# Patient Record
Sex: Male | Born: 1990 | Race: White | Hispanic: No | Marital: Married | Smoking: Current every day smoker
Health system: Southern US, Community
[De-identification: ages and names within clinical notes are randomized; demographics above are authoritative.]

## PROBLEM LIST (undated history)

## (undated) DIAGNOSIS — F32A Depression, unspecified: Secondary | ICD-10-CM

## (undated) DIAGNOSIS — L089 Local infection of the skin and subcutaneous tissue, unspecified: Secondary | ICD-10-CM

## (undated) DIAGNOSIS — F419 Anxiety disorder, unspecified: Secondary | ICD-10-CM

## (undated) DIAGNOSIS — F319 Bipolar disorder, unspecified: Secondary | ICD-10-CM

## (undated) DIAGNOSIS — F909 Attention-deficit hyperactivity disorder, unspecified type: Secondary | ICD-10-CM

## (undated) HISTORY — PX: OTHER SURGICAL HISTORY: SHX169

---
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
## 2011-01-13 ENCOUNTER — Inpatient Hospital Stay (INDEPENDENT_AMBULATORY_CARE_PROVIDER_SITE_OTHER)
Admission: RE | Admit: 2011-01-13 | Discharge: 2011-01-13 | Disposition: A | Discharge status: Home / Self Care | Payer: Medicaid Other | Source: Ambulatory Visit | Attending: Family Medicine | Admitting: Family Medicine

## 2011-01-13 DIAGNOSIS — M799 Soft tissue disorder, unspecified: Secondary | ICD-10-CM

## 2011-02-21 LAB — GLUCOSE, CAPILLARY: Glucose-Capillary: 96 mg/dL (ref 70–99)

## 2011-04-26 NOTE — Op Note (Signed)
[place]  Patient:    Arthur Mcdonald, Arthur Mcdonald Visit Number: [id number] MRN: [id number]          Service Type: DSU Location: [facility] Attending Physician:  Leonia Corona Dictated by:   Judie Petit. Leonia Corona, M.D. Proc. Date: 08/27/01 Admit Date:  08/27/2001   CC:         Louise A. Twiselton, M.D.   Operative Report  PREOPERATIVE DIAGNOSES: 1. Right parietal scalp lesion, possible sebaceous nevus. 2. Painful scar, left forehead.  POSTOPERATIVE DIAGNOSES: 1. Right parietal scalp lesion, possible sebaceous nevus. 2. Painful scar, left forehead.  PROCEDURES:  Wide excision of right parietal scalp lesion.  ANESTHESIA:  General laryngeal mask anesthesia.  SURGEON:  Nelida Meuse, M.D.  ASSISTANTDonnella Bi D. Pendse, M.D.  DESCRIPTION OF PROCEDURE:  The patient is brought into operating room and placed supine upon the operating table.  General laryngeal mask anesthesia is given.  The head is rotated toward the left side to bring the right parietal scalp lesion clearly in the field.  The head was shaved around the lesion and then cleaned and prepped in the usual manner.  A 2 mm margin was marked with a marker all around the oval lesion.  Approximately 9 cc of 0.25% Marcaine with epinephrine was infiltrated around the lesion to minimize operative bleeding as well as postoperative pain control.  The incision was now made with a knife.  A deep incision was made up to the area of the periosteum, saving the periosteum.  Then the oval-shaped lesion with elliptical incision was lifted off the periosteum and removed from the field.  Bleeders were cauterized.  The scalp flap was raised on all sides by opening movement of the scissors for about 1-2 cm on all four sides.  Once again the wound was irrigated and looked for any small bleeders, which were cauterized.  No active bleeding or oozing was noted at this point, and then the wound was approximated and  repaired using 3-0 Prolene interrupted stitches.  Primary closure was easily achieved without much tension of the scar on the suture line.  A sterile dressing was applied.  The patient tolerated the procedure very well.  We now turned our attention toward the left forehead scar.  On very careful palpation, we could not palpate any foreign body; hence, I went out of the operating room to discuss the possibility of not exploring this wound since it appears to have healed since our last examination.  A decision after consultation with the father was made not to explore the left forehead wound; hence, we completed the procedure of wide excision of the right parietal scalp without any problems.  The patient was extubated and transported to the recovery room in good and stable condition. Dictated by:   Judie Petit. Leonia Corona, M.D. Attending Physician:  Leonia Corona DD:  08/27/01 TD:  08/27/01 Job: [id number]

## 2011-04-26 NOTE — Discharge Summary (Signed)
[facility]  Patient:    Arthur Mcdonald, Arthur Mcdonald                        MRN: [id number] Adm. Date:  [id number] Disc. Date: [id number] Attending:  Jasmine Pang Dictator:   Carolanne Grumbling, M.D.                           Discharge Summary  INITIAL ASSESSMENT AND DIAGNOSIS:  Akim was admitted to the hospital after a long history of mood instability and disruptive behaviors.  He had been increasing aggressive according to his family.  He had been screaming and fighting as well as harming others.  He lives with his grandparents, who have cared for him most of his life, but they were feeling unsafe at home with him. He also had begun threatening to run away from home.  He was admitted to the service of Dr. Milford Cage.  MENTAL STATUS:  At the time of the initial evaluation revealed a reserved but friendly boy with poor eye contact.  Mood seemed depressed.  There was no suicidal or homicidal ideation.  There was no evidence of any psychotic thinking or behavior.  He seemed to be of at least average intelligence. Concentration was poor, insight was minimal, and judgment seemed poor.  Other pertinent history can be obtained from the psychosocial service summary.  PHYSICAL EXAMINATION:  Noncontributory.  ADMITTING DIAGNOSES: Axis I:     1. Mood disorder not otherwise specified.             2. Attention deficit hyperactivity disorder, combined type.             3. 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 Axis II:    Deferred. Axis III:   Healthy. Axis IV:    Severe. Axis V:     30.  FINDINGS ON INDICATED LABORATORY EXAMINATIONS:  Within normal limits or noncontributing.  Lithium level on the May 1 was 0.57 and on the May 7 was 1.98.  Dr. Katrinka Blazing was notified regarding the lithium and the family was contacted to decrease the lithium dose.  HOSPITAL COURSE:  While in the hospital Jaskaran initially was very hyper, intrusive, requiring redirection  regularly.  He was friendly but just seemed to be unable to slow himself down.  His medications were adjusted.  His lithium was increased.  Ritalin was added, and Clonidine was increased significantly, and he seemed to calm down at least on the unit.  His grandparents had begun the process of application for [facility] and that continued throughout his stay.  By the time of discharge, he was apologetic to his grandparents for being out of control and all the bad things he had done. He indicated that he loved them, and was happy that someone loved him because he said a lot of kids did not have people to love them.  CONDITION AT TIME OF DISCHARGE:  He was calmer, more focused, required much less redirection and less hyper.  POST HOSPITAL CARE PLAN:  He is referred back to Dr. Phillip Heal with an appointment for May 17 and will follow up with Doran Heater.  At the time of discharge, he was taking Clonidine 0.1 mg four times daily, Lithobid 300 mg in the morning and 600 mg in the afternoon (this dose has been adjusted downwards due to high Lithobid level), Zyprexa 2.5 mg twice daily, Ritalin 10 mg in  the morning and at noon and 5 mg at 4 p.m.  There were no restrictions placed on his activity or his diet.  FINAL DIAGNOSES: Axis I:     1. Mood disorder not otherwise specified.             2. Attention deficit hyperactivity disorder combined type.             3. Intermittent explosive disorder. Axis II:    No diagnosis. Axis III:   Healthy. Axis IV:    Severe. Axis V:     50. DD:  04/28/00 TD:  04/28/00 Job: [id number]
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
## 2011-05-18 ENCOUNTER — Emergency Department (HOSPITAL_COMMUNITY)
Admission: EM | Admit: 2011-05-18 | Discharge: 2011-05-18 | Disposition: A | Discharge status: Home / Self Care | Payer: Medicaid Other | Attending: Emergency Medicine | Admitting: Emergency Medicine

## 2011-05-18 DIAGNOSIS — F319 Bipolar disorder, unspecified: Secondary | ICD-10-CM | POA: Insufficient documentation

## 2011-05-18 DIAGNOSIS — F988 Other specified behavioral and emotional disorders with onset usually occurring in childhood and adolescence: Secondary | ICD-10-CM | POA: Insufficient documentation

## 2011-05-18 DIAGNOSIS — W260XXA Contact with knife, initial encounter: Secondary | ICD-10-CM | POA: Insufficient documentation

## 2011-05-18 DIAGNOSIS — Z79899 Other long term (current) drug therapy: Secondary | ICD-10-CM | POA: Insufficient documentation

## 2011-05-18 DIAGNOSIS — S61209A Unspecified open wound of unspecified finger without damage to nail, initial encounter: Secondary | ICD-10-CM | POA: Insufficient documentation

## 2011-05-18 DIAGNOSIS — Y93G1 Activity, food preparation and clean up: Secondary | ICD-10-CM | POA: Insufficient documentation

## 2011-11-27 ENCOUNTER — Other Ambulatory Visit: Payer: Self-pay

## 2011-11-27 ENCOUNTER — Encounter: Payer: Self-pay | Admitting: Emergency Medicine

## 2011-11-27 ENCOUNTER — Emergency Department (HOSPITAL_COMMUNITY): Payer: Medicaid Other

## 2011-11-27 ENCOUNTER — Emergency Department (HOSPITAL_COMMUNITY)
Admission: EM | Admit: 2011-11-27 | Discharge: 2011-11-27 | Disposition: A | Discharge status: Home / Self Care | Payer: Medicaid Other | Attending: Emergency Medicine | Admitting: Emergency Medicine

## 2011-11-27 DIAGNOSIS — R059 Cough, unspecified: Secondary | ICD-10-CM | POA: Insufficient documentation

## 2011-11-27 DIAGNOSIS — R509 Fever, unspecified: Secondary | ICD-10-CM | POA: Insufficient documentation

## 2011-11-27 DIAGNOSIS — R112 Nausea with vomiting, unspecified: Secondary | ICD-10-CM | POA: Insufficient documentation

## 2011-11-27 DIAGNOSIS — R197 Diarrhea, unspecified: Secondary | ICD-10-CM | POA: Insufficient documentation

## 2011-11-27 DIAGNOSIS — F17200 Nicotine dependence, unspecified, uncomplicated: Secondary | ICD-10-CM | POA: Insufficient documentation

## 2011-11-27 DIAGNOSIS — R05 Cough: Secondary | ICD-10-CM | POA: Insufficient documentation

## 2011-11-27 DIAGNOSIS — J111 Influenza due to unidentified influenza virus with other respiratory manifestations: Secondary | ICD-10-CM | POA: Insufficient documentation

## 2011-11-27 MED ORDER — OXYCODONE-ACETAMINOPHEN 5-325 MG PO TABS: 1.0000 | ORAL_TABLET | ORAL | Status: AC | PRN

## 2011-11-27 MED ORDER — ALBUTEROL SULFATE HFA 108 (90 BASE) MCG/ACT IN AERS
2.0000 | INHALATION_SPRAY | RESPIRATORY_TRACT | Status: DC | PRN
  Administered 2011-11-27: 2 via RESPIRATORY_TRACT
  Filled 2011-11-27: qty 6.7

## 2011-11-27 MED ORDER — ONDANSETRON 8 MG PO TBDP: 8.0000 mg | ORAL_TABLET | Freq: Three times a day (TID) | ORAL | Status: AC | PRN

## 2011-11-27 MED ORDER — MORPHINE SULFATE 4 MG/ML IJ SOLN
4.0000 mg | Freq: Once | INTRAMUSCULAR | Status: AC
  Administered 2011-11-27: 4 mg via INTRAVENOUS
  Filled 2011-11-27: qty 1

## 2011-11-27 MED ORDER — ONDANSETRON HCL 4 MG/2ML IJ SOLN
4.0000 mg | Freq: Once | INTRAMUSCULAR | Status: AC
  Administered 2011-11-27: 4 mg via INTRAVENOUS
  Filled 2011-11-27: qty 2

## 2011-11-27 MED ORDER — SODIUM CHLORIDE 0.9 % IV BOLUS (SEPSIS)
1000.0000 mL | Freq: Once | INTRAVENOUS | Status: AC
  Administered 2011-11-27: 1000 mL via INTRAVENOUS

## 2011-11-27 NOTE — ED Notes (Signed)
Pt stated that he has beecoug

## 2011-11-27 NOTE — ED Provider Notes (Signed)
History     CSN: [id number] Arrival date & time: 11/27/2011  3:45 PM   First MD Initiated Contact with Patient 11/27/11 1955      Chief Complaint  Patient presents with  . Cough  . Fever    (Consider location/radiation/quality/duration/timing/severity/associated sxs/prior treatment) Patient is a 20 y.o. male presenting with cough and fever. The history is provided by the patient.  Cough This is a new problem. The current episode started more than 1 week ago. Associated symptoms include chills and myalgias. Pertinent negatives include no chest pain.  Fever Primary symptoms of the febrile illness include fever, fatigue, cough, nausea, vomiting, diarrhea and myalgias. Primary symptoms do not include rash.   patient states he is a cough with congestion for last 7 days. He states he had fever to is also been vomiting and diarrhea. He has fevers and chills. He has myalgias. He states that contact with other people who have viruses. He states he feels fatigued. He states he is hungry but has been unable to eat.  History reviewed. No pertinent past medical history.  History reviewed. No pertinent past surgical history.  History reviewed. No pertinent family history.  History  Substance Use Topics  . Smoking status: Current Everyday Smoker  . Smokeless tobacco: Not on file  . Alcohol Use: No      Review of Systems  Constitutional: Positive for fever, chills, appetite change and fatigue.  Respiratory: Positive for cough.   Cardiovascular: Negative for chest pain.  Gastrointestinal: Positive for nausea, vomiting and diarrhea.  Genitourinary: Negative for flank pain and decreased urine volume.  Musculoskeletal: Positive for myalgias.  Skin: Negative for rash.  Neurological: Negative for syncope.  Psychiatric/Behavioral: Negative for confusion.    Allergies  Review of patient's allergies indicates no known allergies.  Home Medications   Current Outpatient Rx  Name Route Sig  Dispense Refill  . ONDANSETRON 8 MG PO TBDP Oral Take 1 tablet (8 mg total) by mouth every 8 (eight) hours as needed for nausea. 20 tablet 0  . OXYCODONE-ACETAMINOPHEN 5-325 MG PO TABS Oral Take 1 tablet by mouth every 4 (four) hours as needed for pain. 15 tablet 0    BP 128/81  Pulse 119  Temp(Src) 101.2 F (38.4 C) (Oral)  Resp 18  SpO2 100%  Physical Exam  Nursing note and vitals reviewed. Constitutional: He is oriented to person, place, and time. He appears well-developed and well-nourished.  HENT:  Head: Normocephalic and atraumatic.  Eyes: EOM are normal. Pupils are equal, round, and reactive to light.  Neck: Normal range of motion. Neck supple.  Cardiovascular: Normal rate and normal heart sounds.   No murmur heard.      Tachycardia  Pulmonary/Chest: No respiratory distress. He has wheezes. He has no rales.  Abdominal: Soft. Bowel sounds are normal. He exhibits no distension and no mass. There is no tenderness. There is no rebound and no guarding.  Musculoskeletal: Normal range of motion. He exhibits no edema.  Neurological: He is alert and oriented to person, place, and time. No cranial nerve deficit.  Skin: Skin is warm and dry.  Psychiatric: He has a normal mood and affect.    ED Course  Procedures (including critical care time)  Labs Reviewed - No data to display Dg Chest 2 View  11/27/2011  *RADIOLOGY REPORT*  Clinical Data: Cough, fever  CHEST - 2 VIEW  Comparison: Report 10/03/2000 no images available  Findings: Cardiomediastinal silhouette is unremarkable.  No acute infiltrate or pleural  effusion.  No pulmonary edema.  Central mild increase bronchial markings suspicious for bronchitic changes.  IMPRESSION: No acute infiltrate or edema.  Central mild bronchitic changes are suspected.  Original Report Authenticated By: Natasha Mead, M.D.     1. Influenza     Date: 11/28/2011  Rate: 100  Rhythm: normal sinus rhythm  QRS Axis: normal  Intervals: normal  ST/T  Wave abnormalities: normal  Conduction Disutrbances:none  Narrative Interpretation:   Old EKG Reviewed: unchanged     MDM  Patient likely has flu. Cough congestion fever for 7 days. Chest x-ray was done to the long course of the illness. He did not show pneumonia. He was discharged home.        Juliet Rude. Rubin Payor, MD 11/28/11 [phone]

## 2011-11-27 NOTE — ED Notes (Signed)
Pt came to the hospital because he has been coughing, fever and n/v x few days. Lung sounds clear. No Sob or Chest pain. Will continue to monitor.

## 2011-11-27 NOTE — ED Notes (Signed)
Pt c/o productive cough with congestion and fever x 7 days; pt sts coughing that is making him vomit; pt c/o diarrhea x 3 days

## 2011-12-21 ENCOUNTER — Emergency Department (HOSPITAL_COMMUNITY): Payer: Medicaid Other

## 2011-12-21 ENCOUNTER — Encounter (HOSPITAL_COMMUNITY): Payer: Self-pay | Admitting: Physical Medicine and Rehabilitation

## 2011-12-21 ENCOUNTER — Emergency Department (HOSPITAL_COMMUNITY)
Admission: EM | Admit: 2011-12-21 | Discharge: 2011-12-21 | Disposition: A | Discharge status: Home / Self Care | Payer: Medicaid Other | Attending: Emergency Medicine | Admitting: Emergency Medicine

## 2011-12-21 DIAGNOSIS — S0180XA Unspecified open wound of other part of head, initial encounter: Secondary | ICD-10-CM | POA: Insufficient documentation

## 2011-12-21 DIAGNOSIS — S0181XA Laceration without foreign body of other part of head, initial encounter: Secondary | ICD-10-CM

## 2011-12-21 DIAGNOSIS — F319 Bipolar disorder, unspecified: Secondary | ICD-10-CM | POA: Insufficient documentation

## 2011-12-21 DIAGNOSIS — S0091XA Abrasion of unspecified part of head, initial encounter: Secondary | ICD-10-CM

## 2011-12-21 DIAGNOSIS — M79609 Pain in unspecified limb: Secondary | ICD-10-CM | POA: Insufficient documentation

## 2011-12-21 DIAGNOSIS — Y92009 Unspecified place in unspecified non-institutional (private) residence as the place of occurrence of the external cause: Secondary | ICD-10-CM | POA: Insufficient documentation

## 2011-12-21 DIAGNOSIS — F909 Attention-deficit hyperactivity disorder, unspecified type: Secondary | ICD-10-CM | POA: Insufficient documentation

## 2011-12-21 DIAGNOSIS — R6884 Jaw pain: Secondary | ICD-10-CM | POA: Insufficient documentation

## 2011-12-21 HISTORY — DX: Anxiety disorder, unspecified: F41.9

## 2011-12-21 HISTORY — DX: Bipolar disorder, unspecified: F31.9

## 2011-12-21 HISTORY — DX: Attention-deficit hyperactivity disorder, unspecified type: F90.9

## 2011-12-21 MED ORDER — HYDROCODONE-ACETAMINOPHEN 5-325 MG PO TABS: 1.0000 | ORAL_TABLET | Freq: Four times a day (QID) | ORAL | Status: AC | PRN

## 2011-12-21 MED ORDER — TETANUS-DIPHTH-ACELL PERTUSSIS 5-2.5-18.5 LF-MCG/0.5 IM SUSP
INTRAMUSCULAR | Status: AC
  Administered 2011-12-21: 0.5 mL via INTRAMUSCULAR
  Filled 2011-12-21: qty 0.5

## 2011-12-21 MED ORDER — TETANUS-DIPHTH-ACELL PERTUSSIS 5-2.5-18.5 LF-MCG/0.5 IM SUSP
0.5000 mL | Freq: Once | INTRAMUSCULAR | Status: AC
  Administered 2011-12-21: 0.5 mL via INTRAMUSCULAR
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
## 2011-12-21 NOTE — ED Notes (Signed)
Provider at the bedside performing suture care. Pt tolerating without difficulty. Vital signs stable.  

## 2011-12-21 NOTE — ED Notes (Signed)
Pt transported to xray 
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
## 2011-12-21 NOTE — ED Provider Notes (Signed)
Medical screening examination/treatment/procedure(s) were performed by non-physician practitioner and as supervising physician I was immediately available for consultation/collaboration.   Forbes Cellar, MD 12/21/11 1513

## 2012-01-13 ENCOUNTER — Encounter (HOSPITAL_COMMUNITY): Payer: Self-pay | Admitting: *Deleted

## 2012-01-13 ENCOUNTER — Emergency Department (HOSPITAL_COMMUNITY): Payer: Medicaid Other

## 2012-01-13 ENCOUNTER — Emergency Department (HOSPITAL_COMMUNITY)
Admission: EM | Admit: 2012-01-13 | Discharge: 2012-01-13 | Disposition: A | Discharge status: Home / Self Care | Payer: Medicaid Other | Attending: Emergency Medicine | Admitting: Emergency Medicine

## 2012-01-13 DIAGNOSIS — S60229A Contusion of unspecified hand, initial encounter: Secondary | ICD-10-CM | POA: Insufficient documentation

## 2012-01-13 DIAGNOSIS — F909 Attention-deficit hyperactivity disorder, unspecified type: Secondary | ICD-10-CM | POA: Insufficient documentation

## 2012-01-13 DIAGNOSIS — S60221A Contusion of right hand, initial encounter: Secondary | ICD-10-CM

## 2012-01-13 DIAGNOSIS — IMO0002 Reserved for concepts with insufficient information to code with codable children: Secondary | ICD-10-CM | POA: Insufficient documentation

## 2012-01-13 DIAGNOSIS — F319 Bipolar disorder, unspecified: Secondary | ICD-10-CM | POA: Insufficient documentation

## 2012-01-13 DIAGNOSIS — M7989 Other specified soft tissue disorders: Secondary | ICD-10-CM | POA: Insufficient documentation

## 2012-01-13 DIAGNOSIS — F411 Generalized anxiety disorder: Secondary | ICD-10-CM | POA: Insufficient documentation

## 2012-01-13 DIAGNOSIS — M79609 Pain in unspecified limb: Secondary | ICD-10-CM | POA: Insufficient documentation

## 2012-01-13 MED ORDER — HYDROCODONE-ACETAMINOPHEN 5-325 MG PO TABS
1.0000 | ORAL_TABLET | Freq: Once | ORAL | Status: AC
  Administered 2012-01-13: 1 via ORAL
  Filled 2012-01-13: qty 1

## 2012-01-13 MED ORDER — HYDROCODONE-ACETAMINOPHEN 5-325 MG PO TABS: 1.0000 | ORAL_TABLET | Freq: Four times a day (QID) | ORAL | Status: AC | PRN
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
## 2012-03-02 ENCOUNTER — Encounter (HOSPITAL_COMMUNITY): Payer: Self-pay | Admitting: Emergency Medicine

## 2012-03-02 ENCOUNTER — Emergency Department (HOSPITAL_COMMUNITY)
Admission: EM | Admit: 2012-03-02 | Discharge: 2012-03-02 | Disposition: A | Discharge status: Home / Self Care | Payer: Medicaid Other | Attending: Emergency Medicine | Admitting: Emergency Medicine

## 2012-03-02 DIAGNOSIS — IMO0002 Reserved for concepts with insufficient information to code with codable children: Secondary | ICD-10-CM | POA: Insufficient documentation

## 2012-03-02 DIAGNOSIS — L02413 Cutaneous abscess of right upper limb: Secondary | ICD-10-CM

## 2012-03-02 MED ORDER — TETANUS-DIPHTH-ACELL PERTUSSIS 5-2.5-18.5 LF-MCG/0.5 IM SUSP: 0.5000 mL | Freq: Once | INTRAMUSCULAR | Status: DC

## 2012-03-02 MED ORDER — LIDOCAINE HCL 1 % IJ SOLN
2.0000 mL | Freq: Once | INTRAMUSCULAR | Status: DC
  Filled 2012-03-02: qty 2

## 2012-03-02 MED ORDER — LIDOCAINE HCL 1 % IJ SOLN
INTRAMUSCULAR | Status: AC
  Administered 2012-03-02: 10 mL via SUBCUTANEOUS
  Filled 2012-03-02: qty 20

## 2012-03-02 MED ORDER — CEPHALEXIN 500 MG PO CAPS: 500.0000 mg | ORAL_CAPSULE | Freq: Four times a day (QID) | ORAL | Status: DC

## 2012-03-02 MED ORDER — HYDROCODONE-ACETAMINOPHEN 5-500 MG PO TABS: 1.0000 | ORAL_TABLET | Freq: Four times a day (QID) | ORAL | Status: AC | PRN

## 2012-03-02 MED ORDER — CEPHALEXIN 500 MG PO CAPS
500.0000 mg | ORAL_CAPSULE | Freq: Once | ORAL | Status: AC
  Administered 2012-03-02: 500 mg via ORAL
  Filled 2012-03-02: qty 1

## 2012-03-02 MED ORDER — TETANUS-DIPHTH-ACELL PERTUSSIS 5-2.5-18.5 LF-MCG/0.5 IM SUSP
INTRAMUSCULAR | Status: AC
  Administered 2012-03-02: 0.5 mL via INTRAMUSCULAR
  Filled 2012-03-02: qty 0.5

## 2012-03-02 NOTE — ED Provider Notes (Signed)
Medical screening examination/treatment/procedure(s) were performed by non-physician practitioner and as supervising physician I was immediately available for consultation/collaboration.   Vida Roller, MD 03/02/12 [phone]

## 2012-03-02 NOTE — Discharge Instructions (Signed)
Abscess An abscess (boil or furuncle) is an infected area that contains a collection of pus.  SYMPTOMS Signs and symptoms of an abscess include pain, tenderness, redness, or hardness. You may feel a moveable soft area under your skin. An abscess can occur anywhere in the body.  TREATMENT  A surgical cut (incision) may be made over your abscess to drain the pus. Gauze may be packed into the space or a drain may be looped through the abscess cavity (pocket). This provides a drain that will allow the cavity to heal from the inside outwards. The abscess may be painful for a few days, but should feel much better if it was drained.  Your abscess, if seen early, may not have localized and may not have been drained. If not, another appointment may be required if it does not get better on its own or with medications. HOME CARE INSTRUCTIONS   Only take over-the-counter or prescription medicines for pain, discomfort, or fever as directed by your caregiver.   Take your antibiotics as directed if they were prescribed. Finish them even if you start to feel better.   Keep the skin and clothes clean around your abscess.   If the abscess was drained, you will need to use gauze dressing to collect any draining pus. Dressings will typically need to be changed 3 or more times a day.   The infection may spread by skin contact with others. Avoid skin contact as much as possible.   Practice good hygiene. This includes regular hand washing, cover any draining skin lesions, and do not share personal care items.   If you participate in sports, do not share athletic equipment, towels, whirlpools, or personal care items. Shower after every practice or tournament.   If a draining area cannot be adequately covered:   Do not participate in sports.   Children should not participate in day care until the wound has healed or drainage stops.   If your caregiver has given you a follow-up appointment, it is very important  to keep that appointment. Not keeping the appointment could result in a much worse infection, chronic or permanent injury, pain, and disability. If there is any problem keeping the appointment, you must call back to this facility for assistance.  SEEK MEDICAL CARE IF:   You develop increased pain, swelling, redness, drainage, or bleeding in the wound site.   You develop signs of generalized infection including muscle aches, chills, fever, or a general ill feeling.   You have an oral temperature above 102 F (38.9 C).  MAKE SURE YOU:   Understand these instructions.   Will watch your condition.   Will get help right away if you are not doing well or get worse.  Document Released: 09/04/2005 Document Revised: 11/14/2011 Document Reviewed: 06/28/2008 [facility] Patient Information 2012 [place], [place]. Please apply warm moist cloth over the area 3 or 4 times a day for the next 3-4 days to promote drainage.  Take antibiotics as prescribed.  He been prescribed Vicodin for pain.  He continues his as needed.  I would recommend trying ibuprofen first

## 2012-03-02 NOTE — ED Provider Notes (Signed)
History     CSN: [id number]  Arrival date & time 03/02/12  0213   First MD Initiated Contact with Patient 03/02/12 0309      Chief Complaint  Patient presents with  . Arm Pain    right arm pain,  red streaks,  new tattoo    (Consider location/radiation/quality/duration/timing/severity/associated sxs/prior treatment) HPI Comments: Patient has a tattoo on his right shoulder that is approximately 55-month-old he noticed 3 days ago that he had an abscess on that started as a pimple.  He squeezed the pimple and now as worse with minimal drainage.  Denies any numbness, tingling, to the distal arm, myalgias, or fever  Patient is a 21 y.o. male presenting with arm pain. The history is provided by the patient.  Arm Pain This is a new problem. The current episode started in the past 7 days. The problem occurs constantly. The problem has been gradually worsening. Pertinent negatives include no chills, fever or myalgias.    Past Medical History  Diagnosis Date  . ADHD (attention deficit hyperactivity disorder)   . Bipolar disorder   . Anxiety     Past Surgical History  Procedure Date  . Birthmark removed from right side of head     No family history on file.  History  Substance Use Topics  . Smoking status: Current Everyday Smoker -- 1.0 packs/day    Types: Cigarettes  . Smokeless tobacco: Not on file  . Alcohol Use: No      Review of Systems  Constitutional: Negative for fever and chills.  Musculoskeletal: Negative for myalgias.  Skin: Positive for wound.    Allergies  Review of patient's allergies indicates no known allergies.  Home Medications   Current Outpatient Rx  Name Route Sig Dispense Refill  . CEPHALEXIN 500 MG PO CAPS Oral Take 1 capsule (500 mg total) by mouth 4 (four) times daily. 28 capsule 0  . HYDROCODONE-ACETAMINOPHEN 5-500 MG PO TABS Oral Take 1-2 tablets by mouth every 6 (six) hours as needed for pain. 15 tablet 0    BP 135/65  Pulse 104   Temp(Src) 98.3 F (36.8 C) (Oral)  Resp 20  SpO2 100%  Physical Exam  Constitutional: He appears well-developed and well-nourished.  HENT:  Head: Normocephalic.  Eyes: Pupils are equal, round, and reactive to light.  Neck: Normal range of motion.  Cardiovascular: Normal rate.   Musculoskeletal:       Right shoulder: He exhibits tenderness. He exhibits normal range of motion, no swelling, no effusion and no crepitus.  Neurological: He is alert.  Skin:       Abscess the anterior portion of the right shoulder    ED Course  INCISION AND DRAINAGE Date/Time: 03/02/2012 4:16 AM Performed by: Arman Filter Authorized by: Arman Filter Consent: Verbal consent obtained. Risks and benefits: risks, benefits and alternatives were discussed Consent given by: patient Patient identity confirmed: verbally with patient Time out: Immediately prior to procedure a "time out" was called to verify the correct patient, procedure, equipment, support staff and site/side marked as required. Type: abscess Body area: upper extremity Anesthesia: local infiltration Local anesthetic: lidocaine 1% without epinephrine Anesthetic total: 1 ml Patient sedated: no Scalpel size: 11 Incision type: single straight Complexity: simple Drainage: purulent Drainage amount: moderate Wound treatment: wound left open Patient tolerance: Patient tolerated the procedure well with no immediate complications.   (including critical care time)  Labs Reviewed - No data to display No results found.   1. Abscess  of arm, right       MDM  Anterior shoulder abscess        Arman Filter, NP 03/02/12 [phone]

## 2012-03-02 NOTE — ED Notes (Signed)
Pt has a new Tatoo to his right arm and now has a lump that is open and looks like it is draining

## 2012-03-04 ENCOUNTER — Encounter (HOSPITAL_COMMUNITY): Payer: Self-pay | Admitting: Emergency Medicine

## 2012-03-04 ENCOUNTER — Emergency Department (HOSPITAL_COMMUNITY)
Admission: EM | Admit: 2012-03-04 | Discharge: 2012-03-04 | Disposition: A | Discharge status: Home / Self Care | Payer: Medicaid Other | Attending: Emergency Medicine | Admitting: Emergency Medicine

## 2012-03-04 DIAGNOSIS — L0291 Cutaneous abscess, unspecified: Secondary | ICD-10-CM

## 2012-03-04 DIAGNOSIS — IMO0002 Reserved for concepts with insufficient information to code with codable children: Secondary | ICD-10-CM | POA: Insufficient documentation

## 2012-03-04 DIAGNOSIS — F319 Bipolar disorder, unspecified: Secondary | ICD-10-CM | POA: Insufficient documentation

## 2012-03-04 DIAGNOSIS — F909 Attention-deficit hyperactivity disorder, unspecified type: Secondary | ICD-10-CM | POA: Insufficient documentation

## 2012-03-04 DIAGNOSIS — F17200 Nicotine dependence, unspecified, uncomplicated: Secondary | ICD-10-CM | POA: Insufficient documentation

## 2012-03-04 DIAGNOSIS — F411 Generalized anxiety disorder: Secondary | ICD-10-CM | POA: Insufficient documentation

## 2012-03-04 DIAGNOSIS — Z09 Encounter for follow-up examination after completed treatment for conditions other than malignant neoplasm: Secondary | ICD-10-CM | POA: Insufficient documentation

## 2012-03-04 DIAGNOSIS — R229 Localized swelling, mass and lump, unspecified: Secondary | ICD-10-CM | POA: Insufficient documentation

## 2012-03-04 DIAGNOSIS — M79609 Pain in unspecified limb: Secondary | ICD-10-CM | POA: Insufficient documentation

## 2012-03-04 MED ORDER — HYDROCODONE-ACETAMINOPHEN 5-500 MG PO TABS: 1.0000 | ORAL_TABLET | Freq: Four times a day (QID) | ORAL | Status: DC | PRN

## 2012-03-04 NOTE — ED Notes (Signed)
Pt has an abscess on his right shoulder  Pt states he was seen here on Sunday night for same and had an I&D and was started on Keflex  Pt states it continues to drain pus and has become more painful and is getting bigger

## 2012-03-04 NOTE — ED Provider Notes (Signed)
History     CSN: [id number]  Arrival date & time 03/04/12  6606   First MD Initiated Contact with Patient 03/04/12 2123      Chief Complaint  Patient presents with  . Abscess    (Consider location/radiation/quality/duration/timing/severity/associated sxs/prior treatment) HPI Comments: Patient had absent I&D 2 days ago, he see for recheck stating it is more swollen and painful.  His use all his pain medication.  He is still taking antibiotics  Patient is a 21 y.o. male presenting with abscess. The history is provided by the patient.  Abscess  This is a recurrent problem. The current episode started less than one week ago. The abscess is present on the right arm. The problem is moderate. The abscess is characterized by redness, painfulness and swelling. Pertinent negatives include no fever.    Past Medical History  Diagnosis Date  . ADHD (attention deficit hyperactivity disorder)   . Bipolar disorder   . Anxiety     Past Surgical History  Procedure Date  . 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 Diabetes Other   . Heart attack Other     History  Substance Use Topics  . Smoking status: Current Everyday Smoker -- 1.0 packs/day    Types: Cigarettes  . Smokeless tobacco: Not on file  . Alcohol Use: No      Review of Systems  Constitutional: Negative for fever and chills.  Musculoskeletal: Negative for myalgias and joint swelling.  Skin: Positive for wound.    Allergies  Review of patient's allergies indicates no known allergies.  Home Medications   Current Outpatient Rx  Name Route Sig Dispense Refill  . CEPHALEXIN 500 MG PO CAPS Oral Take 1 capsule (500 mg total) by mouth 4 (four) times daily. 28 capsule 0  . HYDROCODONE-ACETAMINOPHEN 5-500 MG PO TABS Oral Take 1-2 tablets by mouth every 6 (six) hours as needed for pain. 15 tablet 0  . HYDROCODONE-ACETAMINOPHEN 5-500 MG PO TABS Oral Take 1-2 tablets by  mouth every 6 (six) hours as needed for pain. 15 tablet 0    BP 121/64  Pulse 93  Temp(Src) 97.6 F (36.4 C) (Oral)  Resp 20  SpO2 100%  Physical Exam  Constitutional: He appears well-developed and well-nourished.  HENT:  Head: Normocephalic.  Eyes: Pupils are equal, round, and reactive to light.  Neck: Normal range of motion.  Cardiovascular: Normal rate.   Pulmonary/Chest: Effort normal.  Musculoskeletal: Normal range of motion.  Neurological: He is alert.  Skin:       Abscesses the same size as previously noted 2 days ago.  He is having a lot of drainage.  No fever, no tachycardia    ED Course  Procedures (including critical care time)  Labs Reviewed - No data to display No results found.   1. Abscess     The abscess site was manipulated and palpated to facilitate drainage  MDM  Abscess        Arman Filter, NP 03/04/12 2150  Arman Filter, NP 03/04/12 2152

## 2012-03-04 NOTE — Discharge Instructions (Signed)
Abscess An abscess (boil or furuncle) is an infected area under your skin. This area is filled with yellowish white fluid (pus). HOME CARE   Only take medicine as told by your doctor.   Keep the skin clean around your abscess. Keep clothes that may touch the abscess clean.   Change any bandages (dressings) as told by your doctor.   Avoid direct skin contact with other people. The infection can spread by skin contact with others.   Practice good hygiene and do not share personal care items.   Do not share athletic equipment, towels, or whirlpools. Shower after every practice or work out session.   If a draining area cannot be covered:   Do not play sports.   Children should not go to daycare until the wound has healed or until fluid (drainage) stops coming out of the wound.   See your doctor for a follow-up visit as told.  GET HELP RIGHT AWAY IF:   There is more pain, puffiness (swelling), and redness in the wound site.   There is fluid or bleeding from the wound site.   You have muscle aches, chills, fever, or feel sick.   You or your child has a temperature by mouth above 102 F (38.9 C), not controlled by medicine.   Your baby is older than 3 months with a rectal temperature of 102 F (38.9 C) or higher.  MAKE SURE YOU:   Understand these instructions.   Will watch your condition.   Will get help right away if you are not doing well or get worse.  Document Released: 05/13/2008 Document Revised: 11/14/2011 Document Reviewed: 05/13/2008 [facility] Patient Information 2012 [place], [place].\ Please continue to place warm soaks over the area 3-4 times a day, but I see you back in 2 days for recheck.  Please continue the antibiotics until completion

## 2012-03-05 NOTE — ED Provider Notes (Signed)
Medical screening examination/treatment/procedure(s) were performed by non-physician practitioner and as supervising physician I was immediately available for consultation/collaboration.    Aella Ronda L Leanah Kolander, MD 03/05/12 2342 

## 2012-03-06 ENCOUNTER — Emergency Department (HOSPITAL_COMMUNITY)
Admission: EM | Admit: 2012-03-06 | Discharge: 2012-03-06 | Disposition: A | Discharge status: Home / Self Care | Payer: Medicaid Other | Attending: Emergency Medicine | Admitting: Emergency Medicine

## 2012-03-06 ENCOUNTER — Encounter (HOSPITAL_COMMUNITY): Payer: Self-pay | Admitting: Emergency Medicine

## 2012-03-06 DIAGNOSIS — Z5189 Encounter for other specified aftercare: Secondary | ICD-10-CM

## 2012-03-06 DIAGNOSIS — F909 Attention-deficit hyperactivity disorder, unspecified type: Secondary | ICD-10-CM | POA: Insufficient documentation

## 2012-03-06 DIAGNOSIS — IMO0002 Reserved for concepts with insufficient information to code with codable children: Secondary | ICD-10-CM | POA: Insufficient documentation

## 2012-03-06 DIAGNOSIS — F319 Bipolar disorder, unspecified: Secondary | ICD-10-CM | POA: Insufficient documentation

## 2012-03-06 DIAGNOSIS — F17200 Nicotine dependence, unspecified, uncomplicated: Secondary | ICD-10-CM | POA: Insufficient documentation

## 2012-03-06 NOTE — ED Provider Notes (Signed)
History     CSN: [id number]  Arrival date & time 03/06/12  1801   First MD Initiated Contact with Patient 03/06/12 1921      Chief Complaint  Patient presents with  . Wound Check    (Consider location/radiation/quality/duration/timing/severity/associated sxs/prior treatment) HPI History provided by pt and prior chart.  Per prior chart, pt had I&D of abscess of right shoulder on 03/02/12, wound was rechecked in ED on 3/27, it looked similar at that time and was manipulated to facilitate drainage, and pt was instructed to return again in 2 days.  Presents today for recheck.  The abscess first appeared 7 days ago.  Pain and edema are much improved but wound continues to drain purulent fluid, particularly when he squeezes it.  No associated fever.  Has been compliant w/ his Keflex.  Past Medical History  Diagnosis Date  . ADHD (attention deficit hyperactivity disorder)   . Bipolar disorder   . Anxiety     Past Surgical History  Procedure Date  . 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 Diabetes Other   . Heart attack Other     History  Substance Use Topics  . Smoking status: Current Everyday Smoker -- 1.0 packs/day    Types: Cigarettes  . Smokeless tobacco: Not on file  . Alcohol Use: No      Review of Systems  All other systems reviewed and are negative.    Allergies  Review of patient's allergies indicates no known allergies.  Home Medications   Current Outpatient Rx  Name Route Sig Dispense Refill  . CEPHALEXIN 500 MG PO CAPS Oral Take 500 mg by mouth 4 (four) times daily. Started taking 03/02/12 for 7 days.    Marland Kitchen HYDROCODONE-ACETAMINOPHEN 5-500 MG PO TABS Oral Take 1-2 tablets by mouth every 6 (six) hours as needed for pain. 15 tablet 0  . BACITRACIN-NEOMYCIN-POLYMYXIN [id number] EX OINT Topical Apply 1 application topically 4 (four) times daily as needed. As directed      BP 115/72  Pulse 80   Temp(Src) 97.7 F (36.5 C) (Oral)  Resp 18  SpO2 98%  Physical Exam  Nursing note and vitals reviewed. Constitutional: He is oriented to person, place, and time. He appears well-developed and well-nourished. No distress.  HENT:  Head: Normocephalic and atraumatic.  Eyes:       Normal appearance  Neck: Normal range of motion.  Musculoskeletal:       Right lateral, upper arm w/ 1.5cm shallow ulcer draining small amt of serous fluid and blood (overlying a tattoo).  Narrow ring of surrounding erythema and induration.  Minimal ttp. Full, active ROM of shoulder.    Neurological: He is alert and oriented to person, place, and time.  Psychiatric: He has a normal mood and affect. His behavior is normal.    ED Course  Apiration of blood/fluid Date/Time: 03/06/2012 7:58 PM Performed by: Otilio Miu Authorized by: Ruby Cola E Consent: Verbal consent obtained. Risks and benefits: risks, benefits and alternatives were discussed Consent given by: patient Local anesthesia used: yes Anesthesia: local infiltration Local anesthetic: lidocaine 2% with epinephrine Anesthetic total: 4 ml Patient tolerance: Patient tolerated the procedure well with no immediate complications. Comments: Abscess of right lateral upper arm aspirated w/ 18 gauge needle.  No fluid removed.     (including critical care time)   Labs Reviewed - No data to display No results found.  1. Wound check, abscess       MDM  Pt presents for wound recheck.  Subjective improvement. Drainage of sm amt serous fluid from shallow ulceration w/ minimal surrounding erythema/induration/tenderness on exam.  Aspirated to determine if a second I&D necessary and no fluid removed.  Advised pt to continue his abx as prescribed and return to ED if he develops fever or worsening pain/edema.          Otilio Miu, [place] 03/06/12 2001

## 2012-03-06 NOTE — Discharge Instructions (Signed)
Continue your antibiotic as prescribed.  You should return to the ER if you develop fever, worsening pain/swelling or spreading redness of skin.

## 2012-03-06 NOTE — ED Notes (Signed)
Pt seen here twice for abscess to right shoulder, told to come back for recheck.  Pt with redness and swelling around abscess, no drainage noted, pt denies pain.

## 2012-03-06 NOTE — Progress Notes (Signed)
ED CM noted pt with medicaid but no pcp.  Spoke with pt He recently moved back to [place] from [place] had medicaid in [place] and when returned to [place] received [place] Medicaid.  His medicaid card date din 11/2011 does not list a pcp. CM discuss need to speak with a [place] DSS case worker to obtain an assigned pcp or choose one from listed of [place] accepting medicaid pcps provided by Cm and have chosen pcp added to [facility] records/card Pt vocied understanding and appreciation of resources provided.  Also provided with other [place] self pay resources

## 2012-03-06 NOTE — ED Provider Notes (Signed)
Medical screening examination/treatment/procedure(s) were performed by non-physician practitioner and as supervising physician I was immediately available for consultation/collaboration.  Cheri Guppy, MD 03/06/12 [phone]

## 2012-03-23 ENCOUNTER — Encounter (HOSPITAL_COMMUNITY): Payer: Self-pay

## 2012-03-23 ENCOUNTER — Emergency Department (HOSPITAL_COMMUNITY)
Admission: EM | Admit: 2012-03-23 | Discharge: 2012-03-23 | Disposition: A | Discharge status: Home / Self Care | Payer: Medicaid Other | Attending: Emergency Medicine | Admitting: Emergency Medicine

## 2012-03-23 DIAGNOSIS — F319 Bipolar disorder, unspecified: Secondary | ICD-10-CM | POA: Insufficient documentation

## 2012-03-23 DIAGNOSIS — F411 Generalized anxiety disorder: Secondary | ICD-10-CM | POA: Insufficient documentation

## 2012-03-23 DIAGNOSIS — F909 Attention-deficit hyperactivity disorder, unspecified type: Secondary | ICD-10-CM | POA: Insufficient documentation

## 2012-03-23 DIAGNOSIS — F17200 Nicotine dependence, unspecified, uncomplicated: Secondary | ICD-10-CM | POA: Insufficient documentation

## 2012-03-23 DIAGNOSIS — Z9089 Acquired absence of other organs: Secondary | ICD-10-CM | POA: Insufficient documentation

## 2012-03-23 DIAGNOSIS — J029 Acute pharyngitis, unspecified: Secondary | ICD-10-CM | POA: Insufficient documentation

## 2012-03-23 LAB — RAPID STREP SCREEN (MED CTR MEBANE ONLY): Streptococcus, Group A Screen (Direct): NEGATIVE

## 2012-03-23 NOTE — ED Notes (Signed)
Patient was discharged around 0830-0900 this AM. 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

## 2012-03-23 NOTE — ED Provider Notes (Signed)
History     CSN: [id number]  Arrival date & time 03/23/12  0736   First MD Initiated Contact with Patient 03/23/12 [phone]      Chief Complaint  Patient presents with  . Sore Throat  . Headache    (Consider location/radiation/quality/duration/timing/severity/associated sxs/prior treatment) The history is provided by the patient.  Generally healthy 21 y/o M presents to ED with c/c of 3 days sore throat assoc with non-prod cough, mild HA, and intermittent lightheadedness. Denies assoc fever, visual disturbance, ear pain, nasal congestion, SOB, CP, N/V, weakness/numbness to the extremities. Swallowing makes pain worse. Has tried "sore throat spray", though feels nothing makes symptoms better. No prior medical eval for this.  Past Medical History  Diagnosis Date  . ADHD (attention deficit hyperactivity disorder)   . Bipolar disorder   . Anxiety     Past Surgical History  Procedure Date  . 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 Diabetes Other   . Heart attack Other   . Diabetes Mother     History  Substance Use Topics  . Smoking status: Current Everyday Smoker -- 1.0 packs/day    Types: Cigarettes  . Smokeless tobacco: Not on file  . Alcohol Use: No      Review of Systems 10 systems reviewed and are negative for acute change except as noted in the HPI.  Allergies  Review of patient's allergies indicates no known allergies.  Home Medications   Current Outpatient Rx  Name Route Sig Dispense Refill  . NAPROXEN SODIUM 220 MG PO TABS Oral Take 440 mg by mouth 2 (two) times daily as needed. For pain      BP 123/61  Pulse 72  Temp(Src) 97.7 F (36.5 C) (Oral)  Resp 16  SpO2 99%  Physical Exam  Constitutional: He is oriented to person, place, and time. He appears well-developed and well-nourished. No distress.       VS reviewed, normal  HENT:  Head: Normocephalic and atraumatic.  Right Ear: External ear  normal.  Left Ear: External ear normal.  Nose: Nose normal.  Mouth/Throat: Oropharynx is clear and moist. No oropharyngeal exudate.       Bilateral TM normal  Eyes: Conjunctivae are normal. Pupils are equal, round, and reactive to light. Right eye exhibits no discharge. Left eye exhibits no discharge.  Neck: Normal range of motion. Neck supple.  Cardiovascular: Normal rate, regular rhythm and normal heart sounds.   No murmur heard. Pulmonary/Chest: Effort normal and breath sounds normal. No respiratory distress. He has no wheezes. He has no rales. He exhibits no tenderness.  Abdominal: Soft. Bowel sounds are normal. He exhibits no distension. There is no tenderness.  Musculoskeletal: He exhibits no edema and no tenderness.  Lymphadenopathy:    He has no cervical adenopathy.  Neurological: He is alert and oriented to person, place, and time. No cranial nerve deficit. Coordination normal.       Normal gait  Skin: Skin is warm and dry. No rash noted.  Psychiatric: He has a normal mood and affect.    ED Course  Procedures (including critical care time)   Labs Reviewed  RAPID STREP SCREEN   No results found.   Dx 1: Pharyngitis   MDM  Pharyngitis with low suspicion for strep throat as 0/4 centor criteria. Strep screen sent prior to my eval, negative. Advised pt to continue with symptomatic tx. No evidence on exam of  peritonsillar abscess (tonsils surgically absent). No s/s ludwig angina. Non-toxic appearing pt with benign physical examination.        Shaaron Adler, [place] 03/23/12 [phone]

## 2012-03-23 NOTE — ED Provider Notes (Signed)
Medical screening examination/treatment/procedure(s) were performed by non-physician practitioner and as supervising physician I was immediately available for consultation/collaboration.   Gracen Southwell L Sekai Nayak, MD 03/23/12 0902 

## 2012-03-23 NOTE — Discharge Instructions (Signed)
Use ibuprofen or another anti-inflammatory medication such as aleve/naproxen to help with your discomfort. Numbing throat drops such as cepacol or chloraseptic can help as well.    Pharyngitis, Viral and Bacterial Pharyngitis is soreness (inflammation) or infection of the pharynx. It is also called a sore throat. CAUSES  Most sore throats are caused by viruses and are part of a cold. However, some sore throats are caused by strep and other bacteria. Sore throats can also be caused by post nasal drip from draining sinuses, allergies and sometimes from sleeping with an open mouth. Infectious sore throats can be spread from person to person by coughing, sneezing and sharing cups or eating utensils. TREATMENT  Sore throats that are viral usually last 3-4 days. Viral illness will get better without medications (antibiotics). Strep throat and other bacterial infections will usually begin to get better about 24-48 hours after you begin to take antibiotics. HOME CARE INSTRUCTIONS   If the caregiver feels there is a bacterial infection or if there is a positive strep test, they will prescribe an antibiotic. The full course of antibiotics must be taken. If the full course of antibiotic is not taken, you or your child may become ill again. If you or your child has strep throat and do not finish all of the medication, serious heart or kidney diseases may develop.   Drink enough water and fluids to keep your urine clear or pale yellow.   Only take over-the-counter or prescription medicines for pain, discomfort or fever as directed by your caregiver.   Get lots of rest.   Gargle with salt water ( tsp. of salt in a glass of water) as often as every 1-2 hours as you need for comfort.   Hard candies may soothe the throat if individual is not at risk for choking. Throat sprays or lozenges may also be used.  SEEK MEDICAL CARE IF:   Large, tender lumps in the neck develop.   A rash develops.   Green,  yellow-brown or bloody sputum is coughed up.   Your baby is older than 3 months with a rectal temperature of 100.5 F (38.1 C) or higher for more than 1 day.  SEEK IMMEDIATE MEDICAL CARE IF:   A stiff neck develops.   You or your child are drooling or unable to swallow liquids.   You or your child are vomiting, unable to keep medications or liquids down.   You or your child has severe pain, unrelieved with recommended medications.   You or your child are having difficulty breathing (not due to stuffy nose).   You or your child are unable to fully open your mouth.   You or your child develop redness, swelling, or severe pain anywhere on the neck.   You have a fever.   Your baby is older than 3 months with a rectal temperature of 102 F (38.9 C) or higher.   Your baby is 48 months old or younger with a rectal temperature of 100.4 F (38 C) or higher.  MAKE SURE YOU:   Understand these instructions.   Will watch your condition.   Will get help right away if you are not doing well or get worse.  Document Released: 11/25/2005 Document Revised: 11/14/2011 Document Reviewed: 02/22/2008 [facility] Patient Information 2012 [place], [place].           Salt Water Gargle This solution will help make your mouth and throat feel better. HOME CARE INSTRUCTIONS   Mix 1 teaspoon of salt  in 8 ounces of warm water.   Gargle with this solution as much or often as you need or as directed. Swish and gargle gently if you have any sores or wounds in your mouth.   Do not swallow this mixture.  Document Released: 08/29/2004 Document Revised: 11/14/2011 Document Reviewed: 01/20/2009 [facility] Patient Information 2012 [place], [place].          Antibiotic Nonuse  Your caregiver felt that the infection or problem was not one that would be helped with an antibiotic. Infections may be caused by viruses or bacteria. Only a caregiver can tell which one of these is the likely cause of an  illness. A cold is the most common cause of infection in both adults and children. A cold is a virus. Antibiotic treatment will have no effect on a viral infection. Viruses can lead to many lost days of work caring for sick children and many missed days of school. Children may catch as many as 10 "colds" or "flus" per year during which they can be tearful, cranky, and uncomfortable. The goal of treating a virus is aimed at keeping the ill person comfortable. Antibiotics are medications used to help the body fight bacterial infections. There are relatively few types of bacteria that cause infections but there are hundreds of viruses. While both viruses and bacteria cause infection they are very different types of germs. A viral infection will typically go away by itself within 7 to 10 days. Bacterial infections may spread or get worse without antibiotic treatment. Examples of bacterial infections are:  Sore throats (like strep throat or tonsillitis).   Infection in the lung (pneumonia).   Ear and skin infections.  Examples of viral infections are:  Colds or flus.   Most coughs and bronchitis.   Sore throats not caused by Strep.   Runny noses.  It is often best not to take an antibiotic when a viral infection is the cause of the problem. Antibiotics can kill off the helpful bacteria that we have inside our body and allow harmful bacteria to start growing. Antibiotics can cause side effects such as allergies, nausea, and diarrhea without helping to improve the symptoms of the viral infection. Additionally, repeated uses of antibiotics can cause bacteria inside of our body to become resistant. That resistance can be passed onto harmful bacterial. The next time you have an infection it may be harder to treat if antibiotics are used when they are not needed. Not treating with antibiotics allows our own immune system to develop and take care of infections more efficiently. Also, antibiotics will work  better for [place] when they are prescribed for bacterial infections. Treatments for a child that is ill may include:  Give extra fluids throughout the day to stay hydrated.   Get plenty of rest.   Only give your child over-the-counter or prescription medicines for pain, discomfort, or fever as directed by your caregiver.   The use of a cool mist humidifier may help stuffy noses.   Cold medications if suggested by your caregiver.  Your caregiver may decide to start you on an antibiotic if:  The problem you were seen for today continues for a longer length of time than expected.   You develop a secondary bacterial infection.  SEEK MEDICAL CARE IF:  Fever lasts longer than 5 days.   Symptoms continue to get worse after 5 to 7 days or become severe.   Difficulty in breathing develops.   Signs of dehydration develop (poor drinking,  rare urinating, dark colored urine).   Changes in behavior or worsening tiredness (listlessness or lethargy).  Document Released: 02/03/2002 Document Revised: 11/14/2011 Document Reviewed: 08/02/2009 [facility] Patient Information 2012 [place], [place].

## 2012-03-23 NOTE — ED Notes (Signed)
Patient presents with sore throat, light headedness, and headache x 3 days.  Patient using throat spray at home which has not helped decrease the pain.

## 2012-04-17 ENCOUNTER — Emergency Department (HOSPITAL_COMMUNITY)
Admission: EM | Admit: 2012-04-17 | Discharge: 2012-04-17 | Disposition: A | Discharge status: Home / Self Care | Payer: Medicaid Other | Attending: Emergency Medicine | Admitting: Emergency Medicine

## 2012-04-17 ENCOUNTER — Encounter (HOSPITAL_COMMUNITY): Payer: Self-pay

## 2012-04-17 DIAGNOSIS — F319 Bipolar disorder, unspecified: Secondary | ICD-10-CM | POA: Insufficient documentation

## 2012-04-17 DIAGNOSIS — F17200 Nicotine dependence, unspecified, uncomplicated: Secondary | ICD-10-CM | POA: Insufficient documentation

## 2012-04-17 DIAGNOSIS — Z22322 Carrier or suspected carrier of Methicillin resistant Staphylococcus aureus: Secondary | ICD-10-CM | POA: Insufficient documentation

## 2012-04-17 DIAGNOSIS — L0100 Impetigo, unspecified: Secondary | ICD-10-CM | POA: Insufficient documentation

## 2012-04-17 HISTORY — DX: Local infection of the skin and subcutaneous tissue, unspecified: L08.9

## 2012-04-17 MED ORDER — OXYCODONE-ACETAMINOPHEN 5-325 MG PO TABS: 2.0000 | ORAL_TABLET | ORAL | Status: AC | PRN

## 2012-04-17 MED ORDER — MUPIROCIN CALCIUM 2 % EX CREA: TOPICAL_CREAM | Freq: Three times a day (TID) | CUTANEOUS | Status: AC

## 2012-04-17 MED ORDER — SULFAMETHOXAZOLE-TRIMETHOPRIM 800-160 MG PO TABS: 1.0000 | ORAL_TABLET | Freq: Two times a day (BID) | ORAL | Status: AC

## 2012-04-17 MED ORDER — CEPHALEXIN 500 MG PO CAPS: 500.0000 mg | ORAL_CAPSULE | Freq: Four times a day (QID) | ORAL | Status: AC

## 2012-04-17 NOTE — Discharge Instructions (Signed)
Mr Davidow will put you on 2 different antibiotics from the 4 x 4 was at [facility]. 1 covers staph and when covers MRSA. The LIMA prescription was also given to apply to the skin. This ointment may be expensive. Return in 2 days if no improvement or you start running a fever with nausea and vomiting. Take Percocet for severe pain only but do not drive with this medication. Take ibuprofen for the pain when operating machinery.

## 2012-04-17 NOTE — ED Notes (Signed)
Pt presents with no acute distress.  Pt seen and treated for skin infection to rt shoulder in April- Pt reports new addition to tattoo to rt shoulder for three weeks..  Pt reports other infection not present upon application of second tattoo.  Area to upper rt shoulder remains open- multiple sites to rt arm red and blistered

## 2012-04-17 NOTE — ED Provider Notes (Signed)
History     CSN: [id number]  Arrival date & time 04/17/12  7829   First MD Initiated Contact with Patient 04/17/12 1925      Chief Complaint  Patient presents with  . Recurrent Skin Infections  . Cellulitis    (Consider location/radiation/quality/duration/timing/severity/associated sxs/prior treatment) Patient is a 21 y.o. male presenting with rash. The history is provided by the patient. No language interpreter was used.  Rash  This is a new problem. The current episode started more than 1 week ago. The problem has been gradually worsening. Associated with: tatoo. There has been no fever. Affected Location: R upper extremity. The pain is at a severity of 2/10. The pain is mild. The pain has been constant since onset. Associated symptoms include itching and pain. He has tried antibiotic cream for the symptoms.  Rue infection since tatoo 3 weeks ago.  Past Medical History  Diagnosis Date  . ADHD (attention deficit hyperactivity disorder)   . Bipolar disorder   . Anxiety   . Skin infection     Past Surgical History  Procedure Date  . 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 Diabetes Other   . Heart attack Other   . Diabetes Mother     History  Substance Use Topics  . Smoking status: Current Everyday Smoker -- 1.0 packs/day    Types: Cigarettes  . Smokeless tobacco: Not on file  . Alcohol Use: No      Review of Systems  Constitutional: Negative.   HENT: Negative.   Eyes: Negative.   Respiratory: Negative.   Cardiovascular: Negative.   Gastrointestinal: Negative.   Skin: Positive for itching and rash.       RUE with multiple reddened areas and no drainage.  Neurological: Negative.   Psychiatric/Behavioral: Negative.   All other systems reviewed and are negative.    Allergies  Review of patient's allergies indicates no known allergies.  Home Medications   Current Outpatient Rx  Name Route Sig  Dispense Refill  . IBUPROFEN 400 MG PO TABS Oral Take 400 mg by mouth every 6 (six) hours as needed. For pain relief    . NAPROXEN SODIUM 220 MG PO TABS Oral Take 440 mg by mouth 2 (two) times daily as needed. For pain      BP 133/73  Pulse 98  Temp(Src) 98.7 F (37.1 C) (Oral)  Resp 18  Ht 5\' 7"  (1.702 m)  Wt 190 lb (86.183 kg)  BMI 29.76 kg/m2  SpO2 100%  Physical Exam  Nursing note and vitals reviewed. Constitutional: He is oriented to person, place, and time. He appears well-developed and well-nourished.  HENT:  Head: Normocephalic.  Eyes: Conjunctivae and EOM are normal. Pupils are equal, round, and reactive to light.  Neck: Normal range of motion. Neck supple.  Cardiovascular: Normal rate.   Pulmonary/Chest: Effort normal.  Abdominal: Soft.  Musculoskeletal: Normal range of motion.  Neurological: He is alert and oriented to person, place, and time.  Skin: Skin is warm and dry. Rash noted.       New tattoo with ? Infected areas no cellulitis noted  Psychiatric: He has a normal mood and affect.    ED Course  Procedures (including critical care time)  Labs Reviewed - No data to display No results found.   No diagnosis found.    MDM  Upper extremity tattoos 3 weeks ago with suspected impetigo versus MRSA infection. Rx  for Bactrim and Keflex with topical Bactroban. The patient has no M.D. will return if worse. Percocet and ibuprofen for pain. No cellulitis noted.        Remi Haggard, NP 04/17/12 1953

## 2012-04-18 NOTE — ED Provider Notes (Signed)
Medical screening examination/treatment/procedure(s) were performed by non-physician practitioner and as supervising physician I was immediately available for consultation/collaboration.   Forbes Cellar, MD 04/18/12 [phone]

## 2012-08-25 ENCOUNTER — Emergency Department (HOSPITAL_COMMUNITY)
Admission: EM | Admit: 2012-08-25 | Discharge: 2012-08-25 | Disposition: A | Discharge status: Home / Self Care | Payer: Medicaid Other | Attending: Emergency Medicine | Admitting: Emergency Medicine

## 2012-08-25 ENCOUNTER — Encounter (HOSPITAL_COMMUNITY): Payer: Self-pay

## 2012-08-25 DIAGNOSIS — F319 Bipolar disorder, unspecified: Secondary | ICD-10-CM | POA: Insufficient documentation

## 2012-08-25 DIAGNOSIS — F411 Generalized anxiety disorder: Secondary | ICD-10-CM | POA: Insufficient documentation

## 2012-08-25 DIAGNOSIS — Z79899 Other long term (current) drug therapy: Secondary | ICD-10-CM | POA: Insufficient documentation

## 2012-08-25 DIAGNOSIS — F909 Attention-deficit hyperactivity disorder, unspecified type: Secondary | ICD-10-CM | POA: Insufficient documentation

## 2012-08-25 DIAGNOSIS — K644 Residual hemorrhoidal skin tags: Secondary | ICD-10-CM | POA: Insufficient documentation

## 2012-08-25 DIAGNOSIS — F17200 Nicotine dependence, unspecified, uncomplicated: Secondary | ICD-10-CM | POA: Insufficient documentation

## 2012-08-25 MED ORDER — DOCUSATE SODIUM 100 MG PO CAPS: 100.0000 mg | ORAL_CAPSULE | Freq: Two times a day (BID) | ORAL | Status: DC | PRN

## 2012-08-25 MED ORDER — HYDROCORTISONE 2.5 % RE CREA: TOPICAL_CREAM | RECTAL | Status: DC

## 2012-08-25 NOTE — ED Notes (Signed)
PA at bedside.

## 2012-08-25 NOTE — ED Provider Notes (Signed)
History     CSN: [id number]  Arrival date & time 08/25/12  1325   First MD Initiated Contact with Patient 08/25/12 1506      Chief Complaint  Patient presents with  . Abscess    x 2 days    (Consider location/radiation/quality/duration/timing/severity/associated sxs/prior treatment) HPI Comments: Patient reports he has had a "big bump" on the edge of his anus for the past two days.  States he has had this twice before but they have resolved spontaneously.  He is having pain with walking.  He has never had any drainage from the lesions.  Denies fevers.  Pt does have an intermittent history of constipation.    Patient is a 21 y.o. male presenting with abscess. The history is provided by the patient.  Abscess  Pertinent negatives include no fever and no diarrhea.    Past Medical History  Diagnosis Date  . ADHD (attention deficit hyperactivity disorder)   . Bipolar disorder   . Anxiety   . Skin infection     Past Surgical History  Procedure Date  . 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 Diabetes Other   . Heart attack Other   . Diabetes Mother     History  Substance Use Topics  . Smoking status: Current Every Day Smoker -- 1.0 packs/day    Types: Cigarettes  . Smokeless tobacco: Not on file  . Alcohol Use: No      Review of Systems  Constitutional: Negative for fever and chills.  Gastrointestinal: Positive for constipation. Negative for diarrhea and anal bleeding.    Allergies  Review of patient's allergies indicates no known allergies.  Home Medications   Current Outpatient Rx  Name Route Sig Dispense Refill  . METHYLPHENIDATE HCL ER 36 MG PO TBCR Oral Take 36 mg by mouth every morning.    Marland Kitchen QUETIAPINE FUMARATE 100 MG PO TABS Oral Take 100 mg by mouth at bedtime.    Marland Kitchen DOCUSATE SODIUM 100 MG PO CAPS Oral Take 1 capsule (100 mg total) by mouth 2 (two) times daily as needed for constipation. 30  capsule 0  . HYDROCORTISONE 2.5 % RE CREA  Apply rectally 2 times daily 30 g 0    BP 125/58  Pulse 100  Temp 98 F (36.7 C) (Oral)  Resp 20  Wt 168 lb (76.204 kg)  SpO2 99%  Physical Exam  Nursing note and vitals reviewed. Constitutional: He appears well-developed and well-nourished. No distress.  HENT:  Head: Normocephalic and atraumatic.  Neck: Neck supple.  Pulmonary/Chest: Effort normal.  Genitourinary: Rectal exam shows external hemorrhoid.     Neurological: He is alert.  Skin: He is not diaphoretic.    ED Course  Procedures (including critical care time)  Labs Reviewed - No data to display No results found.   1. External hemorrhoid       MDM  Pt with two days of external hemorrhoid.  Hemorrhoid is soft.  No e/o thrombosis.  No need for intervention today.  Discussed hemorrhoids at length with patient, including follow up and return precautions.  Pt d/c home with anusol and colace (pt has hx constipation).  Pt verbalizes understanding and agrees with plan.          [place], [place] 08/25/12 1553

## 2012-08-25 NOTE — ED Notes (Signed)
Pt presents with no acute distress. Pt c/o of rectal pain ?abscess x 2 days

## 2012-08-26 NOTE — ED Provider Notes (Signed)
Medical screening examination/treatment/procedure(s) were performed by non-physician practitioner and as supervising physician I was immediately available for consultation/collaboration.  Trevel Dillenbeck, MD 08/26/12 0028 

## 2012-08-29 ENCOUNTER — Emergency Department (HOSPITAL_COMMUNITY)
Admission: EM | Admit: 2012-08-29 | Discharge: 2012-08-29 | Disposition: A | Discharge status: Home / Self Care | Payer: Medicaid Other | Attending: Emergency Medicine | Admitting: Emergency Medicine

## 2012-08-29 ENCOUNTER — Encounter (HOSPITAL_COMMUNITY): Payer: Self-pay | Admitting: *Deleted

## 2012-08-29 DIAGNOSIS — Z8249 Family history of ischemic heart disease and other diseases of the circulatory system: Secondary | ICD-10-CM | POA: Insufficient documentation

## 2012-08-29 DIAGNOSIS — F411 Generalized anxiety disorder: Secondary | ICD-10-CM | POA: Insufficient documentation

## 2012-08-29 DIAGNOSIS — F319 Bipolar disorder, unspecified: Secondary | ICD-10-CM | POA: Insufficient documentation

## 2012-08-29 DIAGNOSIS — K644 Residual hemorrhoidal skin tags: Secondary | ICD-10-CM | POA: Insufficient documentation

## 2012-08-29 DIAGNOSIS — F909 Attention-deficit hyperactivity disorder, unspecified type: Secondary | ICD-10-CM | POA: Insufficient documentation

## 2012-08-29 DIAGNOSIS — Z833 Family history of diabetes mellitus: Secondary | ICD-10-CM | POA: Insufficient documentation

## 2012-08-29 DIAGNOSIS — F17200 Nicotine dependence, unspecified, uncomplicated: Secondary | ICD-10-CM | POA: Insufficient documentation

## 2012-08-29 MED ORDER — LIDOCAINE HCL 2 % EX GEL
Freq: Once | CUTANEOUS | Status: AC
  Administered 2012-08-29: 1
  Filled 2012-08-29: qty 10

## 2012-08-29 MED ORDER — LIDOCAINE VISCOUS 2 % MT SOLN
20.0000 mL | Freq: Once | OROMUCOSAL | Status: DC
  Filled 2012-08-29: qty 20

## 2012-08-29 NOTE — ED Provider Notes (Signed)
History     CSN: [id number]  Arrival date & time 08/29/12  7829   First MD Initiated Contact with Patient 08/29/12 2155      Chief Complaint  Patient presents with  . Hemorrhoids    pt seen here last week for same problem,  lost papers for follow up referral    (Consider location/radiation/quality/duration/timing/severity/associated sxs/prior treatment) HPI  21 y.o. male in no acute distress complaining of bleeding from hemorrhoids starting today. Patient states he has never had initially bleeding in the past. Patient denies any pain. States that he is taking his stool softener and using the hydrocortisone as directed. Denies fever, nausea vomiting.  Past Medical History  Diagnosis Date  . ADHD (attention deficit hyperactivity disorder)   . Bipolar disorder   . Anxiety   . Skin infection     Past Surgical History  Procedure Date  . 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 Diabetes Other   . Heart attack Other   . Diabetes Mother     History  Substance Use Topics  . Smoking status: Current Every Day Smoker -- 1.0 packs/day    Types: Cigarettes  . Smokeless tobacco: Not on file  . Alcohol Use: No      Review of Systems  Constitutional: Negative for fever.  Respiratory: Negative for shortness of breath.   Cardiovascular: Negative for chest pain.  Gastrointestinal: Positive for anal bleeding. Negative for nausea, vomiting, abdominal pain and diarrhea.  All other systems reviewed and are negative.    Allergies  Review of patient's allergies indicates no known allergies.  Home Medications   Current Outpatient Rx  Name Route Sig Dispense Refill  . DOCUSATE SODIUM 100 MG PO CAPS Oral Take 1 capsule (100 mg total) by mouth 2 (two) times daily as needed for constipation. 30 capsule 0  . HYDROCORTISONE 2.5 % RE CREA  Apply rectally 2 times daily 30 g 0  . METHYLPHENIDATE HCL ER 36 MG PO TBCR Oral Take 36 mg  by mouth every morning.    Marland Kitchen QUETIAPINE FUMARATE 100 MG PO TABS Oral Take 100 mg by mouth at bedtime.      BP 138/67  Pulse 83  Temp 98.5 F (36.9 C) (Oral)  Resp 16  Ht 5\' 7"  (1.702 m)  Wt 168 lb (76.204 kg)  BMI 26.31 kg/m2  SpO2 99%  Physical Exam  Nursing note and vitals reviewed. Constitutional: He is oriented to person, place, and time. He appears well-developed and well-nourished. No distress.  HENT:  Head: Normocephalic.  Eyes: Conjunctivae normal and EOM are normal.  Cardiovascular: Normal rate.   Pulmonary/Chest: Effort normal. No stridor.  Genitourinary:       Soft, nonbleeding external hemorrhoid to rectum at 2:00. Nontender to palpation.  Musculoskeletal: Normal range of motion.  Neurological: He is alert and oriented to person, place, and time.  Psychiatric: He has a normal mood and affect.    ED Course  Procedures (including critical care time)  Labs Reviewed - No data to display No results found.   1. External hemorrhoids without complication       MDM  Uncomplicated external hemorrhoid with new-onset bleeding. Reassured patient. Encouraged outpatient followup.        Wynetta Emery, PA-C 08/29/12 2333

## 2012-08-29 NOTE — ED Notes (Signed)
Pt states he was seen here last week for hemmorhoids and that he lost his information with follow up on it and that the stool softener and cream didn't help,  He figured it would be quicker to return since he has gotten worse,  And there is more bleeding,  Pt is alert and oriented in NAD

## 2012-09-01 NOTE — ED Provider Notes (Signed)
Medical screening examination/treatment/procedure(s) were performed by non-physician practitioner and as supervising physician I was immediately available for consultation/collaboration.  Flint Melter, MD 09/01/12 [phone]

## 2013-08-14 ENCOUNTER — Emergency Department (HOSPITAL_COMMUNITY)
Admission: EM | Admit: 2013-08-14 | Discharge: 2013-08-14 | Disposition: A | Discharge status: Home / Self Care | Payer: Medicaid Other | Attending: Emergency Medicine | Admitting: Emergency Medicine

## 2013-08-14 ENCOUNTER — Encounter (HOSPITAL_COMMUNITY): Payer: Self-pay | Admitting: *Deleted

## 2013-08-14 DIAGNOSIS — J45901 Unspecified asthma with (acute) exacerbation: Secondary | ICD-10-CM

## 2013-08-14 DIAGNOSIS — Z872 Personal history of diseases of the skin and subcutaneous tissue: Secondary | ICD-10-CM | POA: Insufficient documentation

## 2013-08-14 DIAGNOSIS — J329 Chronic sinusitis, unspecified: Secondary | ICD-10-CM

## 2013-08-14 DIAGNOSIS — R5381 Other malaise: Secondary | ICD-10-CM | POA: Insufficient documentation

## 2013-08-14 DIAGNOSIS — Z8659 Personal history of other mental and behavioral disorders: Secondary | ICD-10-CM | POA: Insufficient documentation

## 2013-08-14 DIAGNOSIS — R059 Cough, unspecified: Secondary | ICD-10-CM | POA: Insufficient documentation

## 2013-08-14 DIAGNOSIS — F17200 Nicotine dependence, unspecified, uncomplicated: Secondary | ICD-10-CM | POA: Insufficient documentation

## 2013-08-14 DIAGNOSIS — R05 Cough: Secondary | ICD-10-CM | POA: Insufficient documentation

## 2013-08-14 MED ORDER — ALBUTEROL SULFATE (5 MG/ML) 0.5% IN NEBU
5.0000 mg | INHALATION_SOLUTION | Freq: Once | RESPIRATORY_TRACT | Status: AC
  Administered 2013-08-14: 5 mg via RESPIRATORY_TRACT
  Filled 2013-08-14 (×2): qty 0.5

## 2013-08-14 MED ORDER — AZITHROMYCIN 250 MG PO TABS: 250.0000 mg | ORAL_TABLET | Freq: Every day | ORAL | Status: DC

## 2013-08-14 MED ORDER — ALBUTEROL SULFATE HFA 108 (90 BASE) MCG/ACT IN AERS: 1.0000 | INHALATION_SPRAY | Freq: Four times a day (QID) | RESPIRATORY_TRACT | Status: DC | PRN

## 2013-08-14 MED ORDER — PREDNISONE 20 MG PO TABS
60.0000 mg | ORAL_TABLET | Freq: Once | ORAL | Status: AC
  Administered 2013-08-14: 60 mg via ORAL
  Filled 2013-08-14: qty 3

## 2013-08-14 MED ORDER — PREDNISONE 20 MG PO TABS: 60.0000 mg | ORAL_TABLET | Freq: Every day | ORAL | Status: DC

## 2013-08-14 NOTE — ED Provider Notes (Signed)
Medical screening examination/treatment/procedure(s) were performed by non-physician practitioner and as supervising physician I was immediately available for consultation/collaboration.  Juliet Rude. Rubin Payor, MD 08/14/13 1022

## 2013-08-14 NOTE — ED Notes (Signed)
He states he's been congested, and has had some left-sided facial pain x 2 days.  He is in no distress.

## 2013-08-14 NOTE — ED Provider Notes (Signed)
CSN: [id number]     Arrival date & time 08/14/13  0809 History   First MD Initiated Contact with Patient 08/14/13 0815     No chief complaint on file.  (Consider location/radiation/quality/duration/timing/severity/associated sxs/prior Treatment) HPI Comments: Patient with a history of Asthma presents today with a chief complaint of nasal congestion, sinus pressure, generalized weakness, wheezing, dry cough, and SOB.  He reports that his symptoms have been present for the past 2 days and are gradually worsening.  He reports that he has taken Benadryl and Tylenol for his symptoms without improvement.  He states that he currently does not have an Albuterol inhaler at home.  He denies chest pain.  Denies fever or chills.  He currently smokes 1 ppd.  The history is provided by the patient.    Past Medical History  Diagnosis Date  . ADHD (attention deficit hyperactivity disorder)   . Bipolar disorder   . Anxiety   . Skin infection    Past Surgical History  Procedure Laterality Date  . 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 Diabetes Other   . Heart attack Other   . Diabetes Mother    History  Substance Use Topics  . Smoking status: Current Every Day Smoker -- 1.00 packs/day    Types: Cigarettes  . Smokeless tobacco: Not on file  . Alcohol Use: No    Review of Systems  Constitutional: Negative for fever and chills.  HENT: Positive for congestion, rhinorrhea and sinus pressure.   Respiratory: Positive for cough, shortness of breath and wheezing.   Cardiovascular: Negative for chest pain.  All other systems reviewed and are negative.    Allergies  Review of patient's allergies indicates no known allergies.  Home Medications   Current Outpatient Rx  Name  Route  Sig  Dispense  Refill  . acetaminophen (TYLENOL) 500 MG tablet   Oral   Take 500 mg by mouth every 6 (six) hours as needed for pain.         .  diphenhydrAMINE (BENADRYL) 25 mg capsule   Oral   Take 25 mg by mouth every 6 (six) hours as needed for allergies (& cold).          BP 137/65  Pulse 89  Temp(Src) 98.3 F (36.8 C) (Oral)  Resp 20  SpO2 99% Physical Exam  Nursing note and vitals reviewed. Constitutional: He appears well-developed and well-nourished.  HENT:  Head: Normocephalic and atraumatic.  Right Ear: Hearing, tympanic membrane and ear canal normal.  Left Ear: Hearing, tympanic membrane and ear canal normal.  Nose: Mucosal edema and rhinorrhea present. Right sinus exhibits no maxillary sinus tenderness and no frontal sinus tenderness. Left sinus exhibits maxillary sinus tenderness. Left sinus exhibits no frontal sinus tenderness.  Mouth/Throat: Oropharynx is clear and moist.  Cardiovascular: Normal rate, regular rhythm and normal heart sounds.   Pulmonary/Chest: Effort normal. No respiratory distress. He has wheezes. He has no rales.  Diffuse expiratory wheezing  Neurological: He is alert.  Skin: Skin is warm and dry.  Psychiatric: He has a normal mood and affect.    ED Course  Procedures (including critical care time) Labs Review Labs Reviewed - No data to display Imaging Review No results found.  9:38 AM Reassessed patient after getting breathing treatment.  Lungs CTAB.  Patient reports that his shortness of breath has improved.  MDM  No diagnosis found. Patient presenting with symptoms  consistent with Acute Sinusitis.  Patient also with a history of Asthma and complaining of SOB.  On exam initially patient with diffuse expiratory wheezing.  Lungs CTAB after given breathing treatment.  Pulse ox 99 on RA.  Feel that the patient is stable for discharge.  Patient given prescription for Azithromycin, Albuterol inhaler, and 5 day course of Prednisone.  Return precautions given.      Pascal Lux [place], PA-C 08/14/13 [phone]

## 2013-11-22 ENCOUNTER — Emergency Department (HOSPITAL_COMMUNITY)
Admission: EM | Admit: 2013-11-22 | Discharge: 2013-11-22 | Disposition: A | Discharge status: Home / Self Care | Payer: Medicaid Other | Attending: Emergency Medicine | Admitting: Emergency Medicine

## 2013-11-22 ENCOUNTER — Encounter (HOSPITAL_COMMUNITY): Payer: Self-pay | Admitting: Emergency Medicine

## 2013-11-22 DIAGNOSIS — J029 Acute pharyngitis, unspecified: Secondary | ICD-10-CM | POA: Insufficient documentation

## 2013-11-22 DIAGNOSIS — R0982 Postnasal drip: Secondary | ICD-10-CM | POA: Insufficient documentation

## 2013-11-22 DIAGNOSIS — F17200 Nicotine dependence, unspecified, uncomplicated: Secondary | ICD-10-CM | POA: Insufficient documentation

## 2013-11-22 DIAGNOSIS — Z9089 Acquired absence of other organs: Secondary | ICD-10-CM | POA: Insufficient documentation

## 2013-11-22 DIAGNOSIS — Z872 Personal history of diseases of the skin and subcutaneous tissue: Secondary | ICD-10-CM | POA: Insufficient documentation

## 2013-11-22 DIAGNOSIS — Z8659 Personal history of other mental and behavioral disorders: Secondary | ICD-10-CM | POA: Insufficient documentation

## 2013-11-22 DIAGNOSIS — R42 Dizziness and giddiness: Secondary | ICD-10-CM | POA: Insufficient documentation

## 2013-11-22 MED ORDER — GUAIFENESIN ER 600 MG PO TB12: 1200.0000 mg | ORAL_TABLET | Freq: Two times a day (BID) | ORAL | Status: DC

## 2013-11-22 MED ORDER — IBUPROFEN 800 MG PO TABS: 800.0000 mg | ORAL_TABLET | Freq: Three times a day (TID) | ORAL | Status: DC

## 2013-11-22 MED ORDER — CETIRIZINE-PSEUDOEPHEDRINE ER 5-120 MG PO TB12: 1.0000 | ORAL_TABLET | Freq: Two times a day (BID) | ORAL | Status: DC

## 2013-11-22 NOTE — ED Provider Notes (Signed)
CSN: [id number]     Arrival date & time 11/22/13  1846 History  This chart was scribed for non-physician practitioner  Ivonne Andrew, PA-C  working with No att. providers found by Landis Gandy, ED Scribe. This patient was seen in room WTR5/WTR5 and the patient's care was started at 1846.     Chief Complaint  Patient presents with  . Sore Throat   The history is provided by the patient. No language interpreter was used.   HPI Comments: Arthur Mcdonald is a 22 y.o. male who presents to the Emergency Department complaining of a 3 days of gradual onset, gradually worsening, constant sore throat. He describes this pain as sore and tight and is worsened with swallowing and eating. His associated symptoms are light headedness, rhinorrhea, postnasal drip. He has a history of tonsil and adenoid removal. He states that the pain is worse at night. He has not taken any OTC medications for his symptoms. Pt denies any fever or chills.    Past Medical History  Diagnosis Date  . ADHD (attention deficit hyperactivity disorder)   . Bipolar disorder   . Anxiety   . Skin infection    Past Surgical History  Procedure Laterality Date  . 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 Diabetes Other   . Heart attack Other   . Diabetes Mother    History  Substance Use Topics  . Smoking status: Current Every Day Smoker -- 1.00 packs/day    Types: Cigarettes  . Smokeless tobacco: Not on file  . Alcohol Use: No    Review of Systems  Constitutional: Negative for fever and chills.  HENT: Positive for postnasal drip, rhinorrhea and sore throat.   Neurological: Positive for light-headedness.    Allergies  Review of patient's allergies indicates no known allergies.  Home Medications  No current outpatient prescriptions on file. Triage Vitals: BP 133/75  Pulse 72  Temp(Src) 98 F (36.7 C) (Oral)  Resp 16  SpO2 99% Physical Exam  Nursing note and  vitals reviewed. Constitutional: He is oriented to person, place, and time. He appears well-developed and well-nourished. No distress.  HENT:  Head: Normocephalic and atraumatic.  Mouth/Throat: Posterior oropharyngeal erythema present. No oropharyngeal exudate.  Cobblestoning to the pharynx. Tongue is normal with no lesions or petechia.   Eyes: Conjunctivae and EOM are normal. No scleral icterus.  Neck: Normal range of motion.  Cardiovascular: Normal rate, regular rhythm and normal heart sounds.   Pulmonary/Chest: Effort normal and breath sounds normal. No respiratory distress.  Musculoskeletal: Normal range of motion.  Lymphadenopathy:    He has no cervical adenopathy.  Neurological: He is alert and oriented to person, place, and time.  Skin: Skin is warm and dry. No rash noted. He is not diaphoretic. No erythema. No pallor.  Psychiatric: He has a normal mood and affect. His behavior is normal.    ED Course  Procedures  DIAGNOSTIC STUDIES: Oxygen Saturation is 99% on RA, normal  by my interpretation.    COORDINATION OF CARE: 8:40 PM- patient well-appearing no acute distress. Does not appear severely ill or toxic. Strep screen negative. Physical exam were consistent with postnasal drainage and irritation. Pt advised of plan for treatment and pt agrees.  Results for orders placed during the hospital encounter of 11/22/13  RAPID STREP SCREEN      Result Value Range   Streptococcus, Group A Screen (Direct) NEGATIVE  NEGATIVE     MDM   1. Sore throat     I personally performed the services described in this documentation, which was scribed in my presence. The recorded information has been reviewed and is accurate.     Angus Seller, PA-C 11/23/13 [phone]

## 2013-11-22 NOTE — ED Notes (Signed)
PT reports sore throat x 3 days. Previously had tonsils/adnoids removed. No fever.

## 2013-11-23 NOTE — ED Provider Notes (Signed)
Medical screening examination/treatment/procedure(s) were performed by non-physician practitioner and as supervising physician I was immediately available for consultation/collaboration.  EKG Interpretation   None         Dagmar Hait, MD 11/23/13 1659

## 2013-11-24 LAB — CULTURE, GROUP A STREP

## 2013-11-25 ENCOUNTER — Encounter (HOSPITAL_COMMUNITY): Payer: Self-pay | Admitting: Emergency Medicine

## 2013-11-25 ENCOUNTER — Emergency Department (HOSPITAL_COMMUNITY)
Admission: EM | Admit: 2013-11-25 | Discharge: 2013-11-25 | Disposition: A | Discharge status: Home / Self Care | Payer: Medicaid Other | Attending: Emergency Medicine | Admitting: Emergency Medicine

## 2013-11-25 DIAGNOSIS — Z8659 Personal history of other mental and behavioral disorders: Secondary | ICD-10-CM | POA: Insufficient documentation

## 2013-11-25 DIAGNOSIS — Z872 Personal history of diseases of the skin and subcutaneous tissue: Secondary | ICD-10-CM | POA: Insufficient documentation

## 2013-11-25 DIAGNOSIS — Z79899 Other long term (current) drug therapy: Secondary | ICD-10-CM | POA: Insufficient documentation

## 2013-11-25 DIAGNOSIS — Z791 Long term (current) use of non-steroidal anti-inflammatories (NSAID): Secondary | ICD-10-CM | POA: Insufficient documentation

## 2013-11-25 DIAGNOSIS — F17200 Nicotine dependence, unspecified, uncomplicated: Secondary | ICD-10-CM | POA: Insufficient documentation

## 2013-11-25 DIAGNOSIS — J029 Acute pharyngitis, unspecified: Secondary | ICD-10-CM | POA: Insufficient documentation

## 2013-11-25 MED ORDER — GUAIFENESIN-CODEINE 100-10 MG/5ML PO SOLN: 5.0000 mL | Freq: Three times a day (TID) | ORAL | Status: DC | PRN

## 2013-11-25 MED ORDER — PENICILLIN V POTASSIUM 500 MG PO TABS
250.0000 mg | ORAL_TABLET | Freq: Once | ORAL | Status: AC
  Administered 2013-11-25: 20:00:00 via ORAL
  Filled 2013-11-25: qty 1

## 2013-11-25 MED ORDER — PENICILLIN V POTASSIUM 500 MG PO TABS: 500.0000 mg | ORAL_TABLET | Freq: Three times a day (TID) | ORAL | Status: DC

## 2013-11-25 NOTE — ED Notes (Signed)
Patient states that he continues to have pain and a sore throat since he was here 3 days ago. The medication that was prescribed has not been working.

## 2013-11-25 NOTE — ED Provider Notes (Signed)
CSN: [id number]     Arrival date & time 11/25/13  1821 History   First MD Initiated Contact with Patient 11/25/13 1947     Chief Complaint  Patient presents with  . Sore Throat   (Consider location/radiation/quality/duration/timing/severity/associated sxs/prior Treatment) HPI   Patient to the ER with complaints of sore throat. Arthur Mcdonald was seen in the ED for the same 3 days ago and given Mucinex which Arthur Mcdonald report shaving to "pay over the counter for" because his Medicaid wouldn't pain for it. Arthur Mcdonald denies his symptoms being worse but reports "it is not getting better". Arthur Mcdonald is brought in by his mother who says Arthur Mcdonald needs antibiotics. Pt had negative strep screen three days ago.  Past Medical History  Diagnosis Date  . ADHD (attention deficit hyperactivity disorder)   . Bipolar disorder   . Anxiety   . Skin infection    Past Surgical History  Procedure Laterality Date  . 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 Diabetes Other   . Heart attack Other   . Diabetes Mother    History  Substance Use Topics  . Smoking status: Current Every Day Smoker -- 1.00 packs/day    Types: Cigarettes  . Smokeless tobacco: Not on file  . Alcohol Use: No    Review of Systems The patient denies anorexia, fever, weight loss,, vision loss, decreased hearing, hoarseness, chest pain, syncope, dyspnea on exertion, peripheral edema, balance deficits, hemoptysis, abdominal pain, melena, hematochezia, severe indigestion/heartburn, hematuria, incontinence, genital sores, muscle weakness, suspicious skin lesions, transient blindness, difficulty walking, depression, unusual weight change, abnormal bleeding, enlarged lymph nodes, angioedema, and breast masses.  Allergies  Review of patient's allergies indicates no known allergies.  Home Medications   Current Outpatient Rx  Name  Route  Sig  Dispense  Refill  . cetirizine-pseudoephedrine (ZYRTEC-D) 5-120 MG per  tablet   Oral   Take 1 tablet by mouth 2 (two) times daily.   30 tablet   0   . guaiFENesin (MUCINEX) 600 MG 12 hr tablet   Oral   Take 2 tablets (1,200 mg total) by mouth 2 (two) times daily.   30 tablet   0   . ibuprofen (ADVIL,MOTRIN) 800 MG tablet   Oral   Take 1 tablet (800 mg total) by mouth 3 (three) times daily.   21 tablet   0   . guaiFENesin-codeine 100-10 MG/5ML syrup   Oral   Take 5-10 mLs by mouth 3 (three) times daily as needed for cough.   120 mL   0   . penicillin v potassium (VEETID) 500 MG tablet   Oral   Take 1 tablet (500 mg total) by mouth 3 (three) times daily.   30 tablet   0    BP 134/82  Pulse 104  Temp(Src) 99.7 F (37.6 C) (Oral)  Resp 18  Wt 180 lb (81.647 kg)  SpO2 100% Physical Exam  Nursing note and vitals reviewed. Constitutional: Arthur Mcdonald appears well-developed and well-nourished. No distress.  HENT:  Head: Normocephalic and atraumatic.  Mouth/Throat: Posterior oropharyngeal erythema present.  Eyes: Pupils are equal, round, and reactive to light.  Neck: Normal range of motion. Neck supple.  Cardiovascular: Normal rate and regular rhythm.   Pulmonary/Chest: Effort normal.  Abdominal: Soft.  Neurological: Arthur Mcdonald is alert.  Skin: Skin is warm and dry.    ED Course  Procedures (including critical care time) Labs Review Labs Reviewed -  No data to display Imaging Review No results found.  EKG Interpretation   None       MDM   1. Pharyngitis    guaiFENesin-codeine 100-10 MG/5ML syrup Take 5-10 mLs by mouth 3 (three) times daily as needed for cough. 120 mL Dorthula Matas, PA-C   penicillin v potassium (VEETID) 500 MG tablet Take 1 tablet (500 mg total) by mouth 3 (three) times daily. 30 tablet Dorthula Matas, PA-C  22 y.o.Arthur Mcdonald's evaluation in the Emergency Department is complete. It has been determined that no acute conditions requiring further emergency intervention are present at this time. The patient/guardian  have been advised of the diagnosis and plan. We have discussed signs and symptoms that warrant return to the ED, such as changes or worsening in symptoms.  Vital signs are stable at discharge. Filed Vitals:   11/25/13 1930  BP: 134/82  Pulse: 104  Temp: 99.7 F (37.6 C)  Resp: 18    Patient/guardian has voiced understanding and agreed to follow-up with the PCP or specialist.      Dorthula Matas, PA-C 11/25/13 2009

## 2013-11-25 NOTE — ED Provider Notes (Signed)
Medical screening examination/treatment/procedure(s) were performed by non-physician practitioner and as supervising physician I was immediately available for consultation/collaboration.  EKG Interpretation   None         Richardean Canal, MD 11/25/13 2303

## 2014-05-25 ENCOUNTER — Encounter (HOSPITAL_COMMUNITY): Payer: Self-pay | Admitting: Emergency Medicine

## 2014-05-25 ENCOUNTER — Emergency Department (HOSPITAL_COMMUNITY): Payer: Medicaid Other

## 2014-05-25 ENCOUNTER — Emergency Department (HOSPITAL_COMMUNITY)
Admission: EM | Admit: 2014-05-25 | Discharge: 2014-05-25 | Disposition: A | Discharge status: Home / Self Care | Payer: Medicaid Other | Attending: Emergency Medicine | Admitting: Emergency Medicine

## 2014-05-25 DIAGNOSIS — S0240DA Maxillary fracture, left side, initial encounter for closed fracture: Secondary | ICD-10-CM

## 2014-05-25 DIAGNOSIS — S02400A Malar fracture unspecified, initial encounter for closed fracture: Secondary | ICD-10-CM | POA: Insufficient documentation

## 2014-05-25 DIAGNOSIS — F17200 Nicotine dependence, unspecified, uncomplicated: Secondary | ICD-10-CM | POA: Insufficient documentation

## 2014-05-25 DIAGNOSIS — S02401A Maxillary fracture, unspecified, initial encounter for closed fracture: Principal | ICD-10-CM | POA: Insufficient documentation

## 2014-05-25 DIAGNOSIS — Z872 Personal history of diseases of the skin and subcutaneous tissue: Secondary | ICD-10-CM | POA: Insufficient documentation

## 2014-05-25 DIAGNOSIS — Z8659 Personal history of other mental and behavioral disorders: Secondary | ICD-10-CM | POA: Insufficient documentation

## 2014-05-25 MED ORDER — HYDROCODONE-ACETAMINOPHEN 5-325 MG PO TABS: 2.0000 | ORAL_TABLET | ORAL | Status: DC | PRN

## 2014-05-25 MED ORDER — CEPHALEXIN 500 MG PO CAPS: 500.0000 mg | ORAL_CAPSULE | Freq: Four times a day (QID) | ORAL | Status: DC

## 2014-05-25 MED ORDER — HYDROCODONE-ACETAMINOPHEN 5-325 MG PO TABS
2.0000 | ORAL_TABLET | Freq: Once | ORAL | Status: AC
  Administered 2014-05-25: 2 via ORAL
  Filled 2014-05-25: qty 2

## 2014-05-25 NOTE — ED Provider Notes (Signed)
Pt discussed with me.   Medical screening examination/treatment/procedure(s) were performed by non-physician practitioner and as supervising physician I was immediately available for consultation/collaboration.   EKG Interpretation None      Devoria AlbeIva Knapp, MD, Armando GangFACEP   Ward GivensIva L Knapp, MD 05/25/14 [phone]

## 2014-05-25 NOTE — Discharge Instructions (Signed)
Take Keflex as directed until gone. Take Vicodin as needed for pain. Make an appointment to follow up with Dr. Lazarus SalinesWolicki in 5-7 days. Refer to attached documents for more information.
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
## 2014-08-17 ENCOUNTER — Encounter (HOSPITAL_COMMUNITY): Payer: Self-pay | Admitting: Emergency Medicine

## 2014-08-17 ENCOUNTER — Emergency Department (HOSPITAL_COMMUNITY)
Admission: EM | Admit: 2014-08-17 | Discharge: 2014-08-17 | Disposition: A | Discharge status: Home / Self Care | Payer: Medicaid Other | Attending: Emergency Medicine | Admitting: Emergency Medicine

## 2014-08-17 DIAGNOSIS — Z872 Personal history of diseases of the skin and subcutaneous tissue: Secondary | ICD-10-CM | POA: Insufficient documentation

## 2014-08-17 DIAGNOSIS — Z8659 Personal history of other mental and behavioral disorders: Secondary | ICD-10-CM | POA: Insufficient documentation

## 2014-08-17 DIAGNOSIS — H53149 Visual discomfort, unspecified: Secondary | ICD-10-CM | POA: Diagnosis not present

## 2014-08-17 DIAGNOSIS — G4489 Other headache syndrome: Secondary | ICD-10-CM

## 2014-08-17 DIAGNOSIS — R51 Headache: Secondary | ICD-10-CM | POA: Diagnosis present

## 2014-08-17 DIAGNOSIS — F17200 Nicotine dependence, unspecified, uncomplicated: Secondary | ICD-10-CM | POA: Insufficient documentation

## 2014-08-17 MED ORDER — IBUPROFEN 400 MG PO TABS
400.0000 mg | ORAL_TABLET | Freq: Once | ORAL | Status: AC
  Administered 2014-08-17: 400 mg via ORAL
  Filled 2014-08-17: qty 1

## 2014-08-17 NOTE — Discharge Instructions (Signed)
Headaches, Frequently Asked Questions Take Tylenol or Advil for pain. Return if your condition worsens for any reason. Call and he of the numbers on the resource referral guide to get a primary care physician MIGRAINE HEADACHES Q: What is migraine? What causes it? How can I treat it? A: Generally, migraine headaches begin as a dull ache. Then they develop into a constant, throbbing, and pulsating pain. You may experience pain at the temples. You may experience pain at the front or back of one or both sides of the head. The pain is usually accompanied by a combination of:  Nausea.  Vomiting.  Sensitivity to light and noise. Some people (about 15%) experience an aura (see below) before an attack. The cause of migraine is believed to be chemical reactions in the brain. Treatment for migraine may include over-the-counter or prescription medications. It may also include self-help techniques. These include relaxation training and biofeedback.  Q: What is an aura? A: About 15% of people with migraine get an "aura". This is a sign of neurological symptoms that occur before a migraine headache. You may see wavy or jagged lines, dots, or flashing lights. You might experience tunnel vision or blind spots in one or both eyes. The aura can include visual or auditory hallucinations (something imagined). It may include disruptions in smell (such as strange odors), taste or touch. Other symptoms include:  Numbness.  A "pins and needles" sensation.  Difficulty in recalling or speaking the correct word. These neurological events may last as long as 60 minutes. These symptoms will fade as the headache begins. Q: What is a trigger? A: Certain physical or environmental factors can lead to or "trigger" a migraine. These include:  Foods.  Hormonal changes.  Weather.  Stress. It is important to remember that triggers are different for everyone. To help prevent migraine attacks, you need to figure out which  triggers affect you. Keep a headache diary. This is a good way to track triggers. The diary will help you talk to your healthcare professional about your condition. Q: Does weather affect migraines? A: Bright sunshine, hot, humid conditions, and drastic changes in barometric pressure may lead to, or "trigger," a migraine attack in some people. But studies have shown that weather does not act as a trigger for everyone with migraines. Q: What is the link between migraine and hormones? A: Hormones start and regulate many of your body's functions. Hormones keep your body in balance within a constantly changing environment. The levels of hormones in your body are unbalanced at times. Examples are during menstruation, pregnancy, or menopause. That can lead to a migraine attack. In fact, about three quarters of all women with migraine report that their attacks are related to the menstrual cycle.  Q: Is there an increased risk of stroke for migraine sufferers? A: The likelihood of a migraine attack causing a stroke is very remote. That is not to say that migraine sufferers cannot have a stroke associated with their migraines. In persons under age 69, the most common associated factor for stroke is migraine headache. But over the course of a person's normal life span, the occurrence of migraine headache may actually be associated with a reduced risk of dying from cerebrovascular disease due to stroke.  Q: What are acute medications for migraine? A: Acute medications are used to treat the pain of the headache after it has started. Examples over-the-counter medications, NSAIDs, ergots, and triptans.  Q: What are the triptans? A: Triptans are the newest class  of abortive medications. They are specifically targeted to treat migraine. Triptans are vasoconstrictors. They moderate some chemical reactions in the brain. The triptans work on receptors in your brain. Triptans help to restore the balance of a neurotransmitter  called serotonin. Fluctuations in levels of serotonin are thought to be a main cause of migraine.  Q: Are over-the-counter medications for migraine effective? A: Over-the-counter, or "OTC," medications may be effective in relieving mild to moderate pain and associated symptoms of migraine. But you should see your caregiver before beginning any treatment regimen for migraine.  Q: What are preventive medications for migraine? A: Preventive medications for migraine are sometimes referred to as "prophylactic" treatments. They are used to reduce the frequency, severity, and length of migraine attacks. Examples of preventive medications include antiepileptic medications, antidepressants, beta-blockers, calcium channel blockers, and NSAIDs (nonsteroidal anti-inflammatory drugs). Q: Why are anticonvulsants used to treat migraine? A: During the past few years, there has been an increased interest in antiepileptic drugs for the prevention of migraine. They are sometimes referred to as "anticonvulsants". Both epilepsy and migraine may be caused by similar reactions in the brain.  Q: Why are antidepressants used to treat migraine? A: Antidepressants are typically used to treat people with depression. They may reduce migraine frequency by regulating chemical levels, such as serotonin, in the brain.  Q: What alternative therapies are used to treat migraine? A: The term "alternative therapies" is often used to describe treatments considered outside the scope of conventional Western medicine. Examples of alternative therapy include acupuncture, acupressure, and yoga. Another common alternative treatment is herbal therapy. Some herbs are believed to relieve headache pain. Always discuss alternative therapies with your caregiver before proceeding. Some herbal products contain arsenic and other toxins. TENSION HEADACHES Q: What is a tension-type headache? What causes it? How can I treat it? A: Tension-type headaches occur  randomly. They are often the result of temporary stress, anxiety, fatigue, or anger. Symptoms include soreness in your temples, a tightening band-like sensation around your head (a "vice-like" ache). Symptoms can also include a pulling feeling, pressure sensations, and contracting head and neck muscles. The headache begins in your forehead, temples, or the back of your head and neck. Treatment for tension-type headache may include over-the-counter or prescription medications. Treatment may also include self-help techniques such as relaxation training and biofeedback. CLUSTER HEADACHES Q: What is a cluster headache? What causes it? How can I treat it? A: Cluster headache gets its name because the attacks come in groups. The pain arrives with little, if any, warning. It is usually on one side of the head. A tearing or bloodshot eye and a runny nose on the same side of the headache may also accompany the pain. Cluster headaches are believed to be caused by chemical reactions in the brain. They have been described as the most severe and intense of any headache type. Treatment for cluster headache includes prescription medication and oxygen. SINUS HEADACHES Q: What is a sinus headache? What causes it? How can I treat it? A: When a cavity in the bones of the face and skull (a sinus) becomes inflamed, the inflammation will cause localized pain. This condition is usually the result of an allergic reaction, a tumor, or an infection. If your headache is caused by a sinus blockage, such as an infection, you will probably have a fever. An x-ray will confirm a sinus blockage. Your caregiver's treatment might include antibiotics for the infection, as well as antihistamines or decongestants.  REBOUND HEADACHES Q: What  is a rebound headache? What causes it? How can I treat it? A: A pattern of taking acute headache medications too often can lead to a condition known as "rebound headache." A pattern of taking too much  headache medication includes taking it more than 2 days per week or in excessive amounts. That means more than the label or a caregiver advises. With rebound headaches, your medications not only stop relieving pain, they actually begin to cause headaches. Doctors treat rebound headache by tapering the medication that is being overused. Sometimes your caregiver will gradually substitute a different type of treatment or medication. Stopping may be a challenge. Regularly overusing a medication increases the potential for serious side effects. Consult a caregiver if you regularly use headache medications more than 2 days per week or more than the label advises. ADDITIONAL QUESTIONS AND ANSWERS Q: What is biofeedback? A: Biofeedback is a self-help treatment. Biofeedback uses special equipment to monitor your body's involuntary physical responses. Biofeedback monitors:  Breathing.  Pulse.  Heart rate.  Temperature.  Muscle tension.  Brain activity. Biofeedback helps you refine and perfect your relaxation exercises. You learn to control the physical responses that are related to stress. Once the technique has been mastered, you do not need the equipment any more. Q: Are headaches hereditary? A: Four out of five (80%) of people that suffer report a family history of migraine. Scientists are not sure if this is genetic or a family predisposition. Despite the uncertainty, a child has a 50% chance of having migraine if one parent suffers. The child has a 75% chance if both parents suffer.  Q: Can children get headaches? A: By the time they reach high school, most young people have experienced some type of headache. Many safe and effective approaches or medications can prevent a headache from occurring or stop it after it has begun.  Q: What type of doctor should I see to diagnose and treat my headache? A: Start with your primary caregiver. Discuss his or her experience and approach to headaches. Discuss  methods of classification, diagnosis, and treatment. Your caregiver may decide to recommend you to a headache specialist, depending upon your symptoms or other physical conditions. Having diabetes, allergies, etc., may require a more comprehensive and inclusive approach to your headache. The National Headache Foundation will provide, upon request, a list of [facility] physician members in your state. Document Released: 02/15/2004 Document Revised: 02/17/2012 Document Reviewed: 07/25/2008 [facility] Patient Information 2015 [place], [place]. This information is not intended to replace advice given to you by your health care provider. Make sure you discuss any questions you have with your health care provider.  Emergency Department Resource Guide 1) Find a Doctor and Pay Out of Pocket Although you won't have to find out who is covered by your insurance plan, it is a good idea to ask around and get recommendations. You will then need to call the office and see if the doctor you have chosen will accept you as a new patient and what types of options they offer for patients who are self-pay. Some doctors offer discounts or will set up payment plans for their patients who do not have insurance, but you will need to ask so you aren't surprised when you get to your appointment.  2) Contact Your Local Health Department Not all health departments have doctors that can see patients for sick visits, but many do, so it is worth a call to see if yours does. If you don't know where your  local health department is, you can check in your phone book. The CDC also has a tool to help you locate your state's health department, and many state websites also have listings of all of their local health departments.  3) Find a Walk-in Clinic If your illness is not likely to be very severe or complicated, you may want to try a walk in clinic. These are popping up all over the country in pharmacies, drugstores, and shopping centers. They're  usually staffed by nurse practitioners or physician assistants that have been trained to treat common illnesses and complaints. They're usually fairly quick and inexpensive. However, if you have serious medical issues or chronic medical problems, these are probably not your best option.  No Primary Care Doctor: - Call Health Connect at  [phone] - they can help you locate a primary care doctor that  accepts your insurance, provides certain services, etc. - Physician Referral Service- [phone]  Chronic Pain Problems: Organization         Address  Phone   Notes  [facility]  [phone] Patients need to be referred by their primary care doctor.   Medication Assistance: Organization         Address  Phone   Notes  [facility] Medication [facility] [address] [place] [place], [place] [address] [phone] --Must be a resident of [facility] -- Must have NO insurance coverage whatsoever (no Medicaid/ Medicare, etc.) -- The pt. MUST have a primary care doctor that directs their care regularly and follows them in the community   [facility]  [phone]   [employer]  [phone]    Agencies that provide inexpensive medical care: Organization         Address  Phone   Notes  [facility]  [phone]   [facility]    [phone]   [facility] [address] [place], [place] [address] [phone]   [facility] [place] [address], [place] [phone]   Planned Parenthood    [phone]   [facility]    [phone]   [facility]  [address], [place] Phone:  [phone], Fax:  [phone] Hours of Operation:  9 am - 6 pm, M-F.  Also accepts Medicaid/Medicare and self-pay.  [facility]  [address], [place] Phone: [phone], Fax: [phone]. Hours  of Operation:  8:30 am - 5:30 pm, M-F.  Also accepts Medicaid and self-pay.  [facility] [place] [address], [place] Phone: [phone]   [facility] [address] Natasha Bence [place], [place] [phone], Ext. [phone] Mondays & Thursdays: 7-9 AM.  First 15 patients are seen on a first come, first serve basis.    Medicaid-accepting [facility] Providers:  Organization         Address  Phone   Notes  [facility] [address], [place] [phone] Also accepts self-pay patients.  [facility] [address] Laurell Josephs [place], [place]  [phone]   [facility] [address], [place] [phone]   [facility] [address] [place] [phone]   Renaye Rakers [address], [place]   [phone] Only accepts [place] Access [place] patients after  they have their name applied to their card.   Self-Pay (no insurance) in [facility]:  Organization         Address  Phone   Notes  Sickle Cell Patients, [facility] [address] [place], [place] [phone]   [facility] [address] [place], [place] [phone]   [facility] [place]  [address], [place] [phone]   [facility]/Dr. Osei-Bonsu  [address], [place] or [address], [place] [phone] Phone number for both [place] and [place] locations is the same.  [facility] [address] [place] [phone]   [facility] [address] [place] or [address] [phone]   [facility] [address], [place] [phone], phone; [phone], fax Sees patients 1st and 3rd Saturday of every month.  Must not qualify for public or private insurance (i.e. Medicaid,  Medicare, [place] Health Choice, Veterans' Benefits)  Household income should be no more than 200% of the poverty level The clinic cannot treat you if you are pregnant or think you are pregnant  Sexually transmitted diseases are not treated at the clinic.    Dental Care: Organization         Address  Phone  Notes  [facility] [address] [place], [place] [phone] Accepts children up to age 9 who are enrolled in [place] or [place] Health Choice; pregnant women with a Medicaid card; and children who have applied for Medicaid or [place] Health Choice, but were declined, whose parents can pay a reduced fee at time of service.  [facility] Department of [facility]  [address], [place] [phone] Accepts children up to age 82 who are enrolled in [place] or [place] Health Choice; pregnant women with a Medicaid card; and children who have applied for Medicaid or [place] Health Choice, but were declined, whose parents can pay a reduced fee at time of service.  [facility]  [address] [place], [place] [phone] Patients are seen by appointment only. Walk-ins are not accepted. [facility] will see patients 39 years of age and older. Monday - Tuesday (8am-5pm) Most Wednesdays (8:30-5pm) $30 per visit, cash only  [facility] Adult Dental Access PROGRAM  [address], [facility] [phone] Patients are seen by appointment only. Walk-ins are not accepted. [facility] will see patients 44 years of age and older. One Wednesday Evening (Monthly: Volunteer Based).  $30 per visit, cash only  [employer]  [phone] for adults; Children under age 30, call Graduate Pediatric Dentistry at [phone]. Children aged 42-14, please call [phone] to request a pediatric application.  Dental services are provided in all areas of dental care including fillings, crowns  and bridges, complete and partial dentures, implants, gum treatment, root canals, and extractions. Preventive care is also provided. Treatment is provided to both adults and children. Patients are selected via a lottery and there is often a waiting list.   [facility] [address] [place]  [phone] [email or web address]   [facility] [address] [place], [place] [phone], Ext. [phone] Second and Fourth Thursday of each month, opens at 6:30 AM; Clinic ends at 9 AM.  Patients are  seen on a first-come first-served basis, and a limited number are seen during each clinic.   [facility]  [address] Ether Griffins [place], [place] [phone]   Eligibility Requirements You must have lived in [place], [place], or [place] counties for at least the last three months.   You cannot be eligible for state or federal sponsored [place], including [employer], [place], or [employer].   You generally cannot be eligible for healthcare insurance through your employer.    How to apply: Eligibility screenings are held every Tuesday and Wednesday afternoon from 1:00 pm until 4:00 pm. You do not need an appointment for the interview!  [facility] [address] [place], [place] [phone]   [facility]  [phone]   [facility] Health Department  [phone]   [facility]  [phone]    Behavioral Health Resources in the Community: Intensive Outpatient Programs Organization         Address  Phone  Notes  [facility] [address] [place], [place] [phone]   [facility] Outpatient [address] [place], [place] [phone]   [facility] [address] [place], [place]  [phone]   [facility] Mental Health [address]  [place], [place] [phone] or [phone]   Substance Abuse  Resources Organization         Address  Phone  Notes  [facility]  [phone]   [facility]  [phone]   [facility]  [phone]   Floydene Flock  [phone]   Residential & Outpatient Substance Abuse Program  [phone]   Psychological Services Organization         Address  Phone  Notes  [facility]  [phone]   [facility]  [phone]   [facility] Mental Health [address] [place] [phone] or [phone]    Mobile Crisis Teams Organization         Address  Phone  Notes  [facility], Mobile Crisis Care Unit  [phone]   [facility]  [address] [place], [place] [phone]   Doristine Locks [address] [place] [phone]    Self-Help/Support Groups Organization         Address  Phone             Notes  [facility] [place] - variety of support groups  336- I7437963 Call for more information  Narcotics Anonymous (NA), [facility] [address], [employer] [place]  2 meetings at this location   Statistician         Address  Phone  Notes  [facility] [address],    [place]  [phone]   [facility]  [address] [place] [address], [place], [place] [phone]   [facility] [address] [place], [place] [phone] Admissions: 8am-3pm M-F  [facility] [address],    [place], [place] [phone]   [facility] [address] Starling Manns [place], [place] [phone]   [facility] [address]  [place], [place] [phone]   [facility] - Intensive Outpatient [address]ure[address] Joseph[address], [place], [place] [phone]   [facility] (Addiction Recovery [facility] [address] [place]  [place], [place] [phone] or [phone]   [facility] ([facility]) [address],  [place], [place] [phone] Accepts Medicaid  [facility] [address]  [place] [phone] Substance Abuse/Addiction Treatment   [facility] Behavioral Health Resources Organization         Address  Phone  Notes  [facility]  [phone]   Angie FavaJulie Brannon, PhD [address] [place], [place]   [phone](336) [phone] or [phone]   [facility]    [address] [place], [place] [phone]) [phone]   [facility] [address], [place], [place] [phone](336) [phone] Insurance/Medicaid/sponsorship through [facility]  [facility] [address]                                    [place], [place] [phone]) [phone] Therapy/tele-psych/case  [facility] [address]   [place], [place] [phone](336) [phone]    Dr. Lolly MustacheArfeen  [phone](336) [phone]   [facility]  [employer] [facility] 1) [address], [place] 2) [address], [place] 3)  [address], [place] [phone]) [phone]) [phone]  [phone](336) [phone]   [facility] Child Abuse Hotline [phone](336) [phone] or [phone]) [phone] (After Hours)

## 2014-08-17 NOTE — ED Notes (Signed)
Pt. reports headache onset this evening unrelieved by OTC Advil , denies head injury , no nausea or vomitting . Ambulatory.

## 2014-08-17 NOTE — ED Provider Notes (Signed)
CSN: [id number]     Arrival date & time 08/17/14  0051 History   First MD Initiated Contact with Patient 08/17/14 0330     Chief Complaint  Patient presents with  . Headache     (Consider location/radiation/quality/duration/timing/severity/associated sxs/prior Treatment) HPI Complained of frontal headache dull in nature nonradiating onset 4 PM yesterday associated symptoms include photophobia and feeling of unsteadiness on his feet. He did walk to the emergency department.Marland Kitchen He treated himself with Advil, without relief. However pain is much less presently and is currently mild. He denies fever denies trauma denies other symptoms.. Symptoms felt like migraine he's had in the past. No other associated symptoms Past Medical History  Diagnosis Date  . ADHD (attention deficit hyperactivity disorder)   . Bipolar disorder   . Anxiety   . Skin infection    migraine headaches Past Surgical History  Procedure Laterality Date  . 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 Diabetes Other   . Heart attack Other   . Diabetes Mother    History  Substance Use Topics  . Smoking status: Current Every Day Smoker -- 1.00 packs/day    Types: Cigarettes  . Smokeless tobacco: Not on file  . Alcohol Use: No    Review of Systems  Constitutional: Negative.   Eyes: Positive for photophobia.  Respiratory: Negative.   Cardiovascular: Negative.   Gastrointestinal: Negative.   Musculoskeletal: Positive for gait problem.  Skin: Negative.   Neurological: Positive for headaches.  Psychiatric/Behavioral: Negative.   All other systems reviewed and are negative.     Allergies  Review of patient's allergies indicates no known allergies.  Home Medications   Prior to Admission medications   Medication Sig Start Date End Date Taking? Authorizing Provider  ibuprofen (ADVIL,MOTRIN) 200 MG tablet Take 600 mg by mouth every 6 (six) hours as needed for  headache.   Yes Historical Provider, MD   BP 109/61  Pulse 71  Temp(Src) 97.7 F (36.5 C) (Oral)  Resp 19  SpO2 95% Physical Exam  Nursing note and vitals reviewed. Constitutional: He appears well-developed and well-nourished.  HENT:  Head: Normocephalic and atraumatic.  Eyes: Conjunctivae are normal. Pupils are equal, round, and reactive to light.  Neck: Neck supple. No tracheal deviation present. No thyromegaly present.  Cardiovascular: Normal rate and regular rhythm.   No murmur heard. Pulmonary/Chest: Effort normal and breath sounds normal.  Abdominal: Soft. Bowel sounds are normal. He exhibits no distension. There is no tenderness.  Musculoskeletal: Normal range of motion. He exhibits no edema and no tenderness.  Neurological: He is alert. Coordination normal.  Skin: Skin is warm and dry. No rash noted.  Psychiatric: He has a normal mood and affect.    ED Course  Procedures (including critical care time) Labs Review Labs Reviewed - No data to display  Imaging Review No results found.   EKG Interpretation None      MDM  I offered patient MRI scan of the brain in light of his difficulty walking which he declined. He understands that strokes  do some occur in young people, and agrees to return if symptoms worsen. He declines further testing. He requests Advil for discomfort. I wore for him to resource referral guide Final diagnoses:  None   Diagnosis headache     Doug Sou, MD 08/17/14 [phone]

## 2014-09-02 ENCOUNTER — Emergency Department (HOSPITAL_COMMUNITY)
Admission: EM | Admit: 2014-09-02 | Discharge: 2014-09-02 | Disposition: A | Discharge status: Home / Self Care | Payer: Medicaid Other | Attending: Emergency Medicine | Admitting: Emergency Medicine

## 2014-09-02 ENCOUNTER — Encounter (HOSPITAL_COMMUNITY): Payer: Self-pay | Admitting: Emergency Medicine

## 2014-09-02 DIAGNOSIS — F17200 Nicotine dependence, unspecified, uncomplicated: Secondary | ICD-10-CM | POA: Insufficient documentation

## 2014-09-02 DIAGNOSIS — Z8659 Personal history of other mental and behavioral disorders: Secondary | ICD-10-CM | POA: Insufficient documentation

## 2014-09-02 DIAGNOSIS — L03119 Cellulitis of unspecified part of limb: Principal | ICD-10-CM

## 2014-09-02 DIAGNOSIS — L03115 Cellulitis of right lower limb: Secondary | ICD-10-CM

## 2014-09-02 DIAGNOSIS — L02419 Cutaneous abscess of limb, unspecified: Secondary | ICD-10-CM | POA: Insufficient documentation

## 2014-09-02 MED ORDER — CEPHALEXIN 500 MG PO CAPS: 500.0000 mg | ORAL_CAPSULE | Freq: Four times a day (QID) | ORAL | Status: DC

## 2014-09-02 MED ORDER — HYDROCODONE-ACETAMINOPHEN 5-325 MG PO TABS: 1.0000 | ORAL_TABLET | ORAL | Status: DC | PRN

## 2014-09-02 NOTE — Discharge Instructions (Signed)
Read the information below.  Use the prescribed medication as directed.  Please discuss all new medications with your pharmacist.  Do not take additional tylenol while taking the prescribed pain medication to avoid overdose.  You may return to the Emergency Department at any time for worsening condition or any new symptoms that concern you.  If you develop increased redness, swelling, pus draining from the wound, or fevers greater than 100.4, return to the ER immediately for a recheck.   ° ° °Cellulitis °Cellulitis is an infection of the skin and the tissue under the skin. The infected area is usually red and tender. This happens most often in the arms and lower legs. °HOME CARE  °· Take your antibiotic medicine as told. Finish the medicine even if you start to feel better. °· Keep the infected arm or leg raised (elevated). °· Put a warm cloth on the area up to 4 times per day. °· Only take medicines as told by your doctor. °· Keep all doctor visits as told. °GET HELP IF: °· You see red streaks on the skin coming from the infected area. °· Your red area gets bigger or turns a dark color. °· Your bone or joint under the infected area is painful after the skin heals. °· Your infection comes back in the same area or different area. °· You have a puffy (swollen) bump in the infected area. °· You have new symptoms. °· You have a fever. °GET HELP RIGHT AWAY IF:  °· You feel very sleepy. °· You throw up (vomit) or have watery poop (diarrhea). °· You feel sick and have muscle aches and pains. °MAKE SURE YOU:  °· Understand these instructions. °· Will watch your condition. °· Will get help right away if you are not doing well or get worse. °Document Released: 05/13/2008 Document Revised: 04/11/2014 Document Reviewed: 02/10/2012 °Exit[employer]® Patient Information ©2015 [employer]. This information is not intended to replace advice given to you by your health care provider. Make sure you discuss any questions you have with your  health care provider. ° °

## 2014-09-02 NOTE — ED Provider Notes (Signed)
Medical screening examination/treatment/procedure(s) were performed by non-physician practitioner and as supervising physician I was immediately available for consultation/collaboration.    Darshay Deupree D Mykell Genao, MD 09/02/14 1900 

## 2014-09-02 NOTE — ED Notes (Signed)
Pt reports having abscess to inner right thigh for extended amount of time. Denies red streaks or fever/chills.

## 2014-09-02 NOTE — ED Provider Notes (Signed)
CSN: [id number]     Arrival date & time 09/02/14  1240 History   First MD Initiated Contact with Patient 09/02/14 1323     Chief Complaint  Patient presents with  . Abscess     (Consider location/radiation/quality/duration/timing/severity/associated sxs/prior Treatment) HPI    Patient with painful red area over right inner thigh present for several days.  Has tried to pop the central area of induration without improvement.  Has had some bloody and clear discharge.  Denies fevers, chills, myalgias, N/V, weakness or numbness of the extremities.  No other injuries.  Has had I&D of abscess of shoulder many years ago.    Past Medical History  Diagnosis Date  . ADHD (attention deficit hyperactivity disorder)   . Bipolar disorder   . Anxiety   . Skin infection    Past Surgical History  Procedure Laterality Date  . 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 Diabetes Other   . Heart attack Other   . Diabetes Mother    History  Substance Use Topics  . Smoking status: Current Every Day Smoker -- 1.00 packs/day    Types: Cigarettes  . Smokeless tobacco: Not on file  . Alcohol Use: No    Review of Systems  Constitutional: Negative for fever and chills.  Gastrointestinal: Negative for nausea and vomiting.  Musculoskeletal: Negative for myalgias.  Skin: Positive for wound.  Allergic/Immunologic: Negative for immunocompromised state.  Hematological: Does not bruise/bleed easily.      Allergies  Review of patient's allergies indicates no known allergies.  Home Medications   Prior to Admission medications   Medication Sig Start Date End Date Taking? Authorizing Provider  cephALEXin (KEFLEX) 500 MG capsule Take 1 capsule (500 mg total) by mouth 4 (four) times daily. 09/02/14   Trixie Dredge, PA-C  HYDROcodone-acetaminophen (NORCO/VICODIN) 5-325 MG per tablet Take 1 tablet by mouth every 4 (four) hours as needed for moderate pain  or severe pain. 09/02/14   Trixie Dredge, PA-C   There were no vitals taken for this visit. Physical Exam  Nursing note and vitals reviewed. Constitutional: He appears well-developed and well-nourished. No distress.  HENT:  Head: Normocephalic and atraumatic.  Neck: Neck supple.  Pulmonary/Chest: Effort normal.  Neurological: He is alert.  Skin: He is not diaphoretic.       ED Course  Procedures (including critical care time) Labs Review Labs Reviewed - No data to display  Imaging Review No results found.   EKG Interpretation None      Bedside ultrasound by Kristofer Schaffert reveals no fluid collection   MDM   Final diagnoses:  Cellulitis of right thigh    Afebrile, nontoxic patient with cellulitis of right inner thigh.  I do not believe there is a drainable abscess at this time.  Erythema marked by nurse.   D/C home with keflex, norco.   Discussed result, findings, treatment, and follow up  with patient.  Pt given return precautions.  Pt verbalizes understanding and agrees with plan.         Trixie Dredge, PA-C 09/02/14 1349

## 2014-10-11 ENCOUNTER — Emergency Department (HOSPITAL_COMMUNITY): Payer: Medicaid Other

## 2014-10-11 ENCOUNTER — Emergency Department (HOSPITAL_COMMUNITY)
Admission: EM | Admit: 2014-10-11 | Discharge: 2014-10-11 | Disposition: A | Discharge status: Home / Self Care | Payer: Medicaid Other | Attending: Emergency Medicine | Admitting: Emergency Medicine

## 2014-10-11 ENCOUNTER — Encounter (HOSPITAL_COMMUNITY): Payer: Self-pay | Admitting: Emergency Medicine

## 2014-10-11 DIAGNOSIS — Z872 Personal history of diseases of the skin and subcutaneous tissue: Secondary | ICD-10-CM | POA: Diagnosis not present

## 2014-10-11 DIAGNOSIS — Y9289 Other specified places as the place of occurrence of the external cause: Secondary | ICD-10-CM | POA: Insufficient documentation

## 2014-10-11 DIAGNOSIS — Z792 Long term (current) use of antibiotics: Secondary | ICD-10-CM | POA: Insufficient documentation

## 2014-10-11 DIAGNOSIS — Z720 Tobacco use: Secondary | ICD-10-CM | POA: Diagnosis not present

## 2014-10-11 DIAGNOSIS — Z8659 Personal history of other mental and behavioral disorders: Secondary | ICD-10-CM | POA: Diagnosis not present

## 2014-10-11 DIAGNOSIS — W01198A Fall on same level from slipping, tripping and stumbling with subsequent striking against other object, initial encounter: Secondary | ICD-10-CM | POA: Diagnosis not present

## 2014-10-11 DIAGNOSIS — S20402A Unspecified superficial injuries of left back wall of thorax, initial encounter: Secondary | ICD-10-CM | POA: Insufficient documentation

## 2014-10-11 DIAGNOSIS — S299XXA Unspecified injury of thorax, initial encounter: Secondary | ICD-10-CM | POA: Diagnosis present

## 2014-10-11 DIAGNOSIS — W51XXXA Accidental striking against or bumped into by another person, initial encounter: Secondary | ICD-10-CM | POA: Insufficient documentation

## 2014-10-11 DIAGNOSIS — Y990 Civilian activity done for income or pay: Secondary | ICD-10-CM | POA: Diagnosis not present

## 2014-10-11 DIAGNOSIS — T1490XA Injury, unspecified, initial encounter: Secondary | ICD-10-CM

## 2014-10-11 MED ORDER — CYCLOBENZAPRINE HCL 10 MG PO TABS: 10.0000 mg | ORAL_TABLET | Freq: Two times a day (BID) | ORAL | Status: DC | PRN

## 2014-10-11 MED ORDER — TRAMADOL HCL 50 MG PO TABS
50.0000 mg | ORAL_TABLET | Freq: Four times a day (QID) | ORAL | Status: DC | PRN
Start: 2014-10-11 — End: 2015-11-22

## 2014-10-11 NOTE — Discharge Instructions (Signed)
Take Tramadol as needed for pain. Take Flexeril as needed for muscle spasm. Refer to attached documents for more information.  °

## 2014-10-11 NOTE — ED Provider Notes (Signed)
CSN: [id number]     Arrival date & time 10/11/14  1351 History  This chart was scribed for non-physician practitioner, Emilia BeckKaitlyn Shanai Lartigue, PA-C, working with Joya Gaskinsonald W Wickline, MD by Milly JakobJohn Lee Graves, ED Scribe. The patient was seen in room TR07C/TR07C. Patient's care was started at 2:38 PM.     Chief Complaint  Patient presents with  . Back Injury   Patient is a 23 y.o. male presenting with back pain. The history is provided by the patient. No language interpreter was used.  Back Pain Location:  Thoracic spine Quality:  Aching Radiates to:  Does not radiate Pain severity:  Moderate Pain is:  Worse during the night Onset quality:  Gradual Duration:  16 days Timing:  Constant Progression:  Worsening Chronicity:  New Context: occupational injury   Relieved by:  Nothing Worsened by:  Movement and deep breathing Ineffective treatments:  Heating pad and cold packs Associated symptoms: no numbness and no weakness    HPI Comments: Arthur Mcdonald is a 23 y.o. male who presents to the Emergency Department complaining of severe, left sided back pain onset 2.5 weeks ago after his mid back collided with a tree and two other guys pusehd into him at work. He reports using Icy-Hot patches without relief. He states that the pain is exacerbated by movement, and occasionally by deep breathing at night. He denies abdominal pain.   Past Medical History  Diagnosis Date  . ADHD (attention deficit hyperactivity disorder)   . Bipolar disorder   . Anxiety   . Skin infection    Past Surgical History  Procedure Laterality Date  . 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 Diabetes Other   . Heart attack Other   . Diabetes Mother    History  Substance Use Topics  . Smoking status: Current Every Day Smoker -- 1.00 packs/day    Types: Cigarettes  . Smokeless tobacco: Not on file  . Alcohol Use: No    Review of Systems  Musculoskeletal: Positive  for back pain.  Neurological: Negative for weakness and numbness.  All other systems reviewed and are negative.  Allergies  Review of patient's allergies indicates no known allergies.  Home Medications   Prior to Admission medications   Medication Sig Start Date End Date Taking? Authorizing Provider  cephALEXin (KEFLEX) 500 MG capsule Take 1 capsule (500 mg total) by mouth 4 (four) times daily. 09/02/14   Trixie DredgeEmily West, PA-C  HYDROcodone-acetaminophen (NORCO/VICODIN) 5-325 MG per tablet Take 1 tablet by mouth every 4 (four) hours as needed for moderate pain or severe pain. 09/02/14   Trixie DredgeEmily West, PA-C   Triage Vitals: BP 137/58 mmHg  Pulse 76  Temp(Src) 97 F (36.1 C) (Oral)  Resp 18  SpO2 99% Physical Exam  Constitutional: He is oriented to person, place, and time. He appears well-developed and well-nourished. No distress.  HENT:  Head: Normocephalic and atraumatic.  Eyes: Conjunctivae and EOM are normal.  Neck: Neck supple. No tracheal deviation present.  Cardiovascular: Normal rate.   Pulmonary/Chest: Effort normal. No respiratory distress.  Musculoskeletal: Normal range of motion.  No midline spine tenderness to palpation. Tenderness to palpation of the left thoracic back over the ribs. No obvious deformity.   Neurological: He is alert and oriented to person, place, and time.  Skin: Skin is warm and dry.  Psychiatric: He has a normal mood and affect. His behavior is normal.  Nursing  note and vitals reviewed.   ED Course  Procedures (including critical care time) DIAGNOSTIC STUDIES: Oxygen Saturation is 99% on room air, normal by my interpretation.    COORDINATION OF CARE: 2:42 PM-Discussed treatment plan which includes rib X-ray with pt at bedside and pt agreed to plan.   Labs Review Labs Reviewed - No data to display  Imaging Review Dg Ribs Unilateral W/chest Left  10/11/2014   CLINICAL DATA:  Trauma at 2.5 weeks ago. Persistent pain. Large area of LEFT lateral rib  pain and posterior rib pain.  EXAM: LEFT RIBS AND CHEST - 3+ VIEW  COMPARISON:  11/27/2011.  FINDINGS: No fracture or other bone lesions are seen involving the ribs. There is no evidence of pneumothorax or pleural effusion. Both lungs are clear. Heart size and mediastinal contours are within normal limits.  IMPRESSION: Negative.   Electronically Signed   By: Andreas NewportGeoffrey  Lamke M.D.   On: 10/11/2014 15:58     EKG Interpretation None      MDM   Final diagnoses:  Injury  Superficial injury of left back wall of thorax, initial encounter    Patient's xray unremarkable for acute changes. Patient will be discharged with tramadol and flexeril for symptoms. Vitals stable and patient afebrile.   I personally performed the services described in this documentation, which was scribed in my presence. The recorded information has been reviewed and is accurate.   Emilia BeckKaitlyn Jalisa Sacco, PA-C 10/13/14 0041  Joya Gaskinsonald W Wickline, MD 10/13/14 1330

## 2014-10-11 NOTE — ED Notes (Signed)
Pt states that 2 weeks ago, he fell against a tree then 2 guys fell against him, striking mid back on the tree. Has had continued left mid-back pain since then. No visible deformity. Pain worse with movement.

## 2015-11-22 ENCOUNTER — Emergency Department (HOSPITAL_COMMUNITY)
Admission: EM | Admit: 2015-11-22 | Discharge: 2015-11-22 | Disposition: A | Discharge status: Home / Self Care | Payer: Medicaid Other | Attending: Emergency Medicine | Admitting: Emergency Medicine

## 2015-11-22 ENCOUNTER — Encounter (HOSPITAL_COMMUNITY): Payer: Self-pay | Admitting: *Deleted

## 2015-11-22 DIAGNOSIS — Z872 Personal history of diseases of the skin and subcutaneous tissue: Secondary | ICD-10-CM | POA: Diagnosis not present

## 2015-11-22 DIAGNOSIS — Y9389 Activity, other specified: Secondary | ICD-10-CM | POA: Diagnosis not present

## 2015-11-22 DIAGNOSIS — S4991XA Unspecified injury of right shoulder and upper arm, initial encounter: Secondary | ICD-10-CM | POA: Insufficient documentation

## 2015-11-22 DIAGNOSIS — M25511 Pain in right shoulder: Secondary | ICD-10-CM

## 2015-11-22 DIAGNOSIS — S29002A Unspecified injury of muscle and tendon of back wall of thorax, initial encounter: Secondary | ICD-10-CM | POA: Insufficient documentation

## 2015-11-22 DIAGNOSIS — F419 Anxiety disorder, unspecified: Secondary | ICD-10-CM | POA: Diagnosis not present

## 2015-11-22 DIAGNOSIS — X500XXA Overexertion from strenuous movement or load, initial encounter: Secondary | ICD-10-CM | POA: Diagnosis not present

## 2015-11-22 DIAGNOSIS — Y9289 Other specified places as the place of occurrence of the external cause: Secondary | ICD-10-CM | POA: Diagnosis not present

## 2015-11-22 DIAGNOSIS — Z792 Long term (current) use of antibiotics: Secondary | ICD-10-CM | POA: Insufficient documentation

## 2015-11-22 DIAGNOSIS — F17210 Nicotine dependence, cigarettes, uncomplicated: Secondary | ICD-10-CM | POA: Insufficient documentation

## 2015-11-22 DIAGNOSIS — M546 Pain in thoracic spine: Secondary | ICD-10-CM

## 2015-11-22 DIAGNOSIS — Y990 Civilian activity done for income or pay: Secondary | ICD-10-CM | POA: Diagnosis not present

## 2015-11-22 MED ORDER — TRAMADOL HCL 50 MG PO TABS: 50.0000 mg | ORAL_TABLET | Freq: Four times a day (QID) | ORAL | Status: DC | PRN

## 2015-11-22 MED ORDER — CYCLOBENZAPRINE HCL 10 MG PO TABS
10.0000 mg | ORAL_TABLET | Freq: Two times a day (BID) | ORAL | Status: DC | PRN
Start: 2015-11-22 — End: 2016-11-04

## 2015-11-22 MED ORDER — BUPIVACAINE-EPINEPHRINE (PF) 0.5% -1:200000 IJ SOLN
10.0000 mL | Freq: Once | INTRAMUSCULAR | Status: DC
  Filled 2015-11-22 (×2): qty 10

## 2015-11-22 MED ORDER — LIDOCAINE-EPINEPHRINE (PF) 2 %-1:200000 IJ SOLN
10.0000 mL | Freq: Once | INTRAMUSCULAR | Status: AC
  Administered 2015-11-22: 10 mL
  Filled 2015-11-22: qty 20

## 2015-11-22 NOTE — ED Notes (Signed)
SEE PA assessment 

## 2015-11-22 NOTE — Discharge Instructions (Signed)
Trigger Point Injection Trigger points are areas where you have muscle pain. A trigger point injection is a shot given in the trigger point to relieve that pain. A trigger point might feel like a knot in your muscle. It hurts to press on a trigger point. Sometimes the pain spreads out (radiates) to other parts of the body. For example, pressing on a trigger point in your shoulder might cause pain in your arm or neck. You might have one trigger point. Or, you might have more than one. People often have trigger points in their upper back and lower back. They also occur often in the neck and shoulders. Pain from a trigger point lasts for a long time. It can make it hard to keep moving. You might not be able to do the exercise or physical therapy that could help you deal with the pain. A trigger point injection may help. It does not work for everyone. But, it may relieve your pain for a few days or a few months. A trigger point injection does not cure long-lasting (chronic) pain. LET YOUR CAREGIVER KNOW ABOUT:  Any allergies (especially to latex, lidocaine, or steroids).  Blood-thinning medicines that you take. These drugs can lead to bleeding or bruising after an injection. They include:  Aspirin.  Ibuprofen.  Clopidogrel.  Warfarin.  Other medicines you take. This includes all vitamins, herbs, eyedrops, over-the-counter medicines, and creams.  Use of steroids.  Recent infections.  Past problems with numbing medicines.  Bleeding problems.  Surgeries you have had.  Other health problems. RISKS AND COMPLICATIONS A trigger point injection is a safe treatment. However, problems may develop, such as:  Minor side effects usually go away in 1 to 2 days. These may include:  Soreness.  Bruising.  Stiffness.  More serious problems are rare. But, they may include:  Bleeding under the skin (hematoma).  Skin infection.  Breaking off of the needle under your skin.  Lung  puncture.  The trigger point injection may not work for you. BEFORE THE PROCEDURE You may need to stop taking any medicine that thins your blood. This is to prevent bleeding and bruising. Usually these medicines are stopped several days before the injection. No other preparation is needed. PROCEDURE  A trigger point injection can be given in your caregiver's office or in a clinic. Each injection takes 2 minutes or less.  Your caregiver will feel for trigger points. The caregiver may use a marker to circle the area for the injection.  The skin over the trigger point will be washed with a germ-killing (antiseptic) solution.  The caregiver pinches the spot for the injection.  Then, a very thin needle is used for the shot. You may feel pain or a twitching feeling when the needle enters the trigger point.  A numbing solution may be injected into the trigger point. Sometimes a drug to keep down swelling, redness, and warmth (inflammation) is also injected.  Your caregiver moves the needle around the trigger zone until the tightness and twitching goes away.  After the injection, your caregiver may put gentle pressure over the injection site.  Then it is covered with a bandage. AFTER THE PROCEDURE  You can go right home after the injection.  The bandage can be taken off after a few hours.  You may feel sore and stiff for 1 to 2 days.  Go back to your regular activities slowly. Your caregiver may ask you to stretch your muscles. Do not do anything that takes   extra energy for a few days.  Follow your caregiver's instructions to manage and treat other pain.   This information is not intended to replace advice given to you by your health care provider. Make sure you discuss any questions you have with your health care provider.   Document Released: 11/14/2011 Document Revised: 03/22/2013 Document Reviewed: 11/14/2011 Elsevier Interactive Patient Education 2016 [employer]  

## 2015-11-22 NOTE — ED Notes (Signed)
Declined W/C at D/C and was escorted to lobby by RN. 

## 2015-11-22 NOTE — ED Notes (Signed)
Pt reports his back and RT shoulder pain that started while using equipment at work. Pt reports pain with movement.

## 2015-11-22 NOTE — ED Provider Notes (Signed)
CSN: [id number]     Arrival date & time 11/22/15  0915 History  By signing my name below, I, Essence Howell, attest that this documentation has been prepared under the direction and in the presence of Arthor Captain, PA-C Electronically Signed: Charline Bills, ED Scribe 11/22/2015 at 10:12 AM.   Chief Complaint  Patient presents with  . Back Pain  . Shoulder Pain   The history is provided by the patient. No language interpreter was used.   HPI Comments: Arthur Mcdonald is a 24 y.o. male who presents to the Emergency Department complaining of constant, 5/10 right posterior shoulder pain onset yesterday. Pt states that he was using a 300 lb 1 arm leaf pickup yesterday when he felt a pinch in his right shoulder. Pain is exacerbated with lifting and palpation; slightly improved with rest. He reports similar pain last year while working. No treatments tried PTA. No known medical allergies.    Past Medical History  Diagnosis Date  . ADHD (attention deficit hyperactivity disorder)   . Bipolar disorder   . Anxiety   . Skin infection    Past Surgical History  Procedure Laterality Date  . 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 Diabetes Other   . Heart attack Other   . Diabetes Mother    Social History  Substance Use Topics  . Smoking status: Current Every Day Smoker -- 1.00 packs/day    Types: Cigarettes  . Smokeless tobacco: Not on file  . Alcohol Use: No    Review of Systems  Musculoskeletal: Positive for arthralgias.   Allergies  Review of patient's allergies indicates no known allergies.  Home Medications   Prior to Admission medications   Medication Sig Start Date End Date Taking? Authorizing Provider  cephALEXin (KEFLEX) 500 MG capsule Take 1 capsule (500 mg total) by mouth 4 (four) times daily. 09/02/14   Trixie Dredge, PA-C  cyclobenzaprine (FLEXERIL) 10 MG tablet Take 1 tablet (10 mg total) by mouth 2 (two) times daily  as needed for muscle spasms. 10/11/14   Emilia Beck, PA-C  HYDROcodone-acetaminophen (NORCO/VICODIN) 5-325 MG per tablet Take 1 tablet by mouth every 4 (four) hours as needed for moderate pain or severe pain. 09/02/14   Trixie Dredge, PA-C  traMADol (ULTRAM) 50 MG tablet Take 1 tablet (50 mg total) by mouth every 6 (six) hours as needed. 10/11/14   Kaitlyn Szekalski, PA-C   BP 143/68 mmHg  Pulse 100  Temp(Src) 97.6 F (36.4 C) (Oral)  Resp 16  Ht  (1.702 m)  Wt 207 lb 8 oz (94.121 kg)  BMI 32.49 kg/m2  SpO2 99% Physical Exam  Constitutional: He is oriented to person, place, and time. He appears well-developed and well-nourished. No distress.  HENT:  Head: Normocephalic and atraumatic.  Eyes: Conjunctivae and EOM are normal.  Neck: Neck supple. No tracheal deviation present.  Cardiovascular: Normal rate.   Pulmonary/Chest: Effort normal. No respiratory distress.  Musculoskeletal: Normal range of motion.  Active trigger point in R infraspinatus   Neurological: He is alert and oriented to person, place, and time.  Skin: Skin is warm and dry.  Psychiatric: He has a normal mood and affect. His behavior is normal.  Nursing note and vitals reviewed.  ED Course  Procedures (including critical care time) DIAGNOSTIC STUDIES: Oxygen Saturation is 99% on RA, normal by my interpretation.    COORDINATION OF CARE: 10:09 AM-Discussed treatment plan  which includes trigger point injection and f/u with ortho with pt at bedside and pt agreed to plan.   Labs Review Labs Reviewed - No data to display  Imaging Review No results found. I have personally reviewed and evaluated these images and lab results as part of my medical decision-making.   EKG Interpretation None      Discussed Risks and benefits of procedure with the patient. ID confirmed by provided demographics. Time out called just prior to procedure Patient consents to procedure.  A trigger point injection was performed at  the site of maximal tenderness In the Right infraspinatus using 2% Lidocaine w/epi. This was well tolerated, and followed by immediate relief of pain.  A trigger point injection was performed at the site of maximal tenderness  In the Right Upper trapezius using 2% Lidocaine w/epi . This was well tolerated, and followed by immediate relief of pain.   A trigger point injection was performed at the site of maximal tenderness in the left lower trapezius using 2% Lidocaine w/epi . This was well tolerated, and followed by immediate relief of pain.   MDM  Acute pain in rotator cuff muscles over the posterior scapula. Pain is reproducible. Pain with external rotation of the shoulder. Will treat with a trigger point injection. Follow-up with ortho.   Final diagnoses:  Trigger point of shoulder region, right  Trigger point of thoracic region   Patient with 2 active trigger points.  Immediate relief of sxs. No bony abnormalities. Follow up with ortho.  I personally performed the services described in this documentation, which was scribed in my presence. The recorded information has been reviewed and is accurate.       Arthor Captainbigail Kato Wieczorek, PA-C 11/22/15 1111  Tilden FossaElizabeth Rees, MD 11/23/15 [phone]

## 2016-01-29 ENCOUNTER — Emergency Department (HOSPITAL_COMMUNITY)
Admission: EM | Admit: 2016-01-29 | Discharge: 2016-01-29 | Disposition: A | Discharge status: Home / Self Care | Payer: Medicaid Other | Attending: Emergency Medicine | Admitting: Emergency Medicine

## 2016-01-29 ENCOUNTER — Encounter (HOSPITAL_COMMUNITY): Payer: Self-pay | Admitting: Emergency Medicine

## 2016-01-29 DIAGNOSIS — F909 Attention-deficit hyperactivity disorder, unspecified type: Secondary | ICD-10-CM | POA: Diagnosis not present

## 2016-01-29 DIAGNOSIS — Z792 Long term (current) use of antibiotics: Secondary | ICD-10-CM | POA: Insufficient documentation

## 2016-01-29 DIAGNOSIS — F319 Bipolar disorder, unspecified: Secondary | ICD-10-CM | POA: Diagnosis not present

## 2016-01-29 DIAGNOSIS — G8929 Other chronic pain: Secondary | ICD-10-CM | POA: Diagnosis not present

## 2016-01-29 DIAGNOSIS — M546 Pain in thoracic spine: Secondary | ICD-10-CM | POA: Insufficient documentation

## 2016-01-29 DIAGNOSIS — M25511 Pain in right shoulder: Secondary | ICD-10-CM | POA: Insufficient documentation

## 2016-01-29 DIAGNOSIS — M549 Dorsalgia, unspecified: Secondary | ICD-10-CM

## 2016-01-29 DIAGNOSIS — F419 Anxiety disorder, unspecified: Secondary | ICD-10-CM | POA: Diagnosis not present

## 2016-01-29 DIAGNOSIS — M545 Low back pain: Secondary | ICD-10-CM | POA: Diagnosis not present

## 2016-01-29 DIAGNOSIS — Z872 Personal history of diseases of the skin and subcutaneous tissue: Secondary | ICD-10-CM | POA: Diagnosis not present

## 2016-01-29 DIAGNOSIS — F17210 Nicotine dependence, cigarettes, uncomplicated: Secondary | ICD-10-CM | POA: Insufficient documentation

## 2016-01-29 MED ORDER — METHOCARBAMOL 500 MG PO TABS
500.0000 mg | ORAL_TABLET | Freq: Four times a day (QID) | ORAL | Status: DC | PRN
Start: 2016-01-29 — End: 2016-11-04

## 2016-01-29 MED ORDER — MELOXICAM 7.5 MG PO TABS: 7.5000 mg | ORAL_TABLET | Freq: Every day | ORAL | Status: DC | PRN

## 2016-01-29 NOTE — ED Notes (Signed)
Pt from home for back pain and right shoulder pain, pt states hx of injury to back x1 year ago and has had problems ever since. Pt states sometimes is unable to abduct left and right shoulder due to pain. nad noted, pt ambulatory in triage. Denies any numbness/tingling also denies any urinary symtposm.

## 2016-01-29 NOTE — Discharge Instructions (Signed)
Read the information below.  Use the prescribed medication as directed.  Please discuss all new medications with your pharmacist.  You may return to the Emergency Department at any time for worsening condition or any new symptoms that concern you.     If you develop fevers, loss of control of bowel or bladder, weakness or numbness in your legs, or are unable to walk, return to the ER for a recheck.    Chronic Back Pain  When back pain lasts longer than 3 months, it is called chronic back pain.People with chronic back pain often go through certain periods that are more intense (flare-ups).  CAUSES Chronic back pain can be caused by wear and tear (degeneration) on different structures in your back. These structures include: 1. The bones of your spine (vertebrae) and the joints surrounding your spinal cord and nerve roots (facets). 2. The strong, fibrous tissues that connect your vertebrae (ligaments). Degeneration of these structures may result in pressure on your nerves. This can lead to constant pain. HOME CARE INSTRUCTIONS 1. Avoid bending, heavy lifting, prolonged sitting, and activities which make the problem worse. 2. Take brief periods of rest throughout the day to reduce your pain. Lying down or standing usually is better than sitting while you are resting. 3. Take over-the-counter or prescription medicines only as directed by your caregiver. SEEK IMMEDIATE MEDICAL CARE IF:  1. You have weakness or numbness in one of your legs or feet. 2. You have trouble controlling your bladder or bowels. 3. You have nausea, vomiting, abdominal pain, shortness of breath, or fainting.   This information is not intended to replace advice given to you by your health care provider. Make sure you discuss any questions you have with your health care provider.   Document Released: 01/02/2005 Document Revised: 02/17/2012 Document Reviewed: 05/15/2015 Elsevier Interactive Patient Education 2016 Elsevier  Inc.  Back Exercises The following exercises strengthen the muscles that help to support the back. They also help to keep the lower back flexible. Doing these exercises can help to prevent back pain or lessen existing pain. If you have back pain or discomfort, try doing these exercises 2-3 times each day or as told by your health care provider. When the pain goes away, do them once each day, but increase the number of times that you repeat the steps for each exercise (do more repetitions). If you do not have back pain or discomfort, do these exercises once each day or as told by your health care provider. EXERCISES Single Knee to Chest Repeat these steps 3-5 times for each leg: 3. Lie on your back on a firm bed or the floor with your legs extended. 4. Bring one knee to your chest. Your other leg should stay extended and in contact with the floor. 5. Hold your knee in place by grabbing your knee or thigh. 6. Pull on your knee until you feel a gentle stretch in your lower back. 7. Hold the stretch for 10-30 seconds. 8. Slowly release and straighten your leg. Pelvic Tilt Repeat these steps 5-10 times: 4. Lie on your back on a firm bed or the floor with your legs extended. 5. Bend your knees so they are pointing toward the ceiling and your feet are flat on the floor. 6. Tighten your lower abdominal muscles to press your lower back against the floor. This motion will tilt your pelvis so your tailbone points up toward the ceiling instead of pointing to your feet or the floor. 7.  With gentle tension and even breathing, hold this position for 5-10 seconds. Cat-Cow Repeat these steps until your lower back becomes more flexible: 4. Get into a hands-and-knees position on a firm surface. Keep your hands under your shoulders, and keep your knees under your hips. You may place padding under your knees for comfort. 5. Let your head hang down, and point your tailbone toward the floor so your lower back  becomes rounded like the back of a cat. 6. Hold this position for 5 seconds. 7. Slowly lift your head and point your tailbone up toward the ceiling so your back forms a sagging arch like the back of a cow. 8. Hold this position for 5 seconds. Press-Ups Repeat these steps 5-10 times: 1. Lie on your abdomen (face-down) on the floor. 2. Place your palms near your head, about shoulder-width apart. 3. While you keep your back as relaxed as possible and keep your hips on the floor, slowly straighten your arms to raise the top half of your body and lift your shoulders. Do not use your back muscles to raise your upper torso. You may adjust the placement of your hands to make yourself more comfortable. 4. Hold this position for 5 seconds while you keep your back relaxed. 5. Slowly return to lying flat on the floor. Bridges Repeat these steps 10 times: 1. Lie on your back on a firm surface. 2. Bend your knees so they are pointing toward the ceiling and your feet are flat on the floor. 3. Tighten your buttocks muscles and lift your buttocks off of the floor until your waist is at almost the same height as your knees. You should feel the muscles working in your buttocks and the back of your thighs. If you do not feel these muscles, slide your feet 1-2 inches farther away from your buttocks. 4. Hold this position for 3-5 seconds. 5. Slowly lower your hips to the starting position, and allow your buttocks muscles to relax completely. If this exercise is too easy, try doing it with your arms crossed over your chest. Abdominal Crunches Repeat these steps 5-10 times: 1. Lie on your back on a firm bed or the floor with your legs extended. 2. Bend your knees so they are pointing toward the ceiling and your feet are flat on the floor. 3. Cross your arms over your chest. 4. Tip your chin slightly toward your chest without bending your neck. 5. Tighten your abdominal muscles and slowly raise your trunk (torso)  high enough to lift your shoulder blades a tiny bit off of the floor. Avoid raising your torso higher than that, because it can put too much stress on your low back and it does not help to strengthen your abdominal muscles. 6. Slowly return to your starting position. Back Lifts Repeat these steps 5-10 times: 1. Lie on your abdomen (face-down) with your arms at your sides, and rest your forehead on the floor. 2. Tighten the muscles in your legs and your buttocks. 3. Slowly lift your chest off of the floor while you keep your hips pressed to the floor. Keep the back of your head in line with the curve in your back. Your eyes should be looking at the floor. 4. Hold this position for 3-5 seconds. 5. Slowly return to your starting position. SEEK MEDICAL CARE IF:  Your back pain or discomfort gets much worse when you do an exercise.  Your back pain or discomfort does not lessen within 2 hours after you  exercise. If you have any of these problems, stop doing these exercises right away. Do not do them again unless your health care provider says that you can. SEEK IMMEDIATE MEDICAL CARE IF:  You develop sudden, severe back pain. If this happens, stop doing the exercises right away. Do not do them again unless your health care provider says that you can.   This information is not intended to replace advice given to you by your health care provider. Make sure you discuss any questions you have with your health care provider.   Document Released: 01/02/2005 Document Revised: 08/16/2015 Document Reviewed: 01/19/2015 Elsevier Interactive Patient Education 2016 [employer].    Emergency Department Resource Guide 1) Find a Doctor and Pay Out of Pocket Although you won't have to find out who is covered by your insurance plan, it is a good idea to ask around and get recommendations. You will then need to call the office and see if the doctor you have chosen will accept you as a new patient and what types  of options they offer for patients who are self-pay. Some doctors offer discounts or will set up payment plans for their patients who do not have insurance, but you will need to ask so you aren't surprised when you get to your appointment.  2) Contact Your Local Health Department Not all health departments have doctors that can see patients for sick visits, but many do, so it is worth a call to see if yours does. If you don't know where your local health department is, you can check in your phone book. The CDC also has a tool to help you locate your state's health department, and many state websites also have listings of all of their local health departments.  3) Find a Walk-in Clinic If your illness is not likely to be very severe or complicated, you may want to try a walk in clinic. These are popping up all over the country in pharmacies, drugstores, and shopping centers. They're usually staffed by nurse practitioners or physician assistants that have been trained to treat common illnesses and complaints. They're usually fairly quick and inexpensive. However, if you have serious medical issues or chronic medical problems, these are probably not your best option.  No Primary Care Doctor: - Call Health Connect at  [phone] - they can help you locate a primary care doctor that  accepts your insurance, provides certain services, etc. - Physician Referral Service- [phone]  Chronic Pain Problems: Organization         Address  Phone   Notes  [facility]  [phone] Patients need to be referred by their primary care doctor.   Medication Assistance: Organization         Address  Phone   Notes  [facility] Medication [facility] [address] [place] [place], [place] [address] [phone] --Must be a resident of [facility] -- Must have NO insurance coverage whatsoever (no Medicaid/ Medicare, etc.) -- The pt. MUST have a primary care doctor that  directs their care regularly and follows them in the community   [facility]  [phone]   [employer]  [phone]    Agencies that provide inexpensive medical care: Organization         Address  Phone   Notes  [facility]  [phone]   [facility]    [phone]   [facility] [address]  [address] [place], [place] [address] [phone]   [facility] [address] [place], [place] [phone]   Planned Parenthood    [phone]   [facility]    [phone]   [facility]  [address], [place] Phone:  [phone], Fax:  [phone] Hours of Operation:  9 am - 6 pm, M-F.  Also accepts Medicaid/Medicare and self-pay.  [facility]  [address], [place] Phone: [phone], Fax: [phone]. Hours of Operation:  8:30 am - 5:30 pm, M-F.  Also accepts Medicaid and self-pay.  [facility] [place] [address], [place] Phone: [phone]   [facility] [address] Natasha Bence [place], [place] [phone], Ext. [phone] Mondays & Thursdays: 7-9 AM.  First 15 patients are seen on a first come, first serve basis.    Medicaid-accepting [facility] Providers:  Organization         Address  Phone   Notes  [facility] [address], [place] [phone] Also accepts self-pay patients.  [facility] [address] Laurell Josephs [place], [place]  [phone]   [facility] [address], [place] [phone]   [facility] [address] [place] [phone]   Renaye Rakers [address], [place]   [phone] Only accepts [place] patients after they have their name applied to their card.   Self-Pay (no insurance) in [facility]:  Organization          Address  Phone   Notes  Sickle Cell Patients, [facility] Internal Medicine [address] [place], [place] [phone]   [facility] [address] [place], [place] [phone]   [facility] [place]  [address], [place] [phone]   [facility]/Dr. Osei-Bonsu  [address] [place] or [address], [place] [phone] Phone number for both [place] and [place] locations is the same.  [facility] [address] [place] [phone]   [facility] [address], [place] or [address] [phone]   [facility] [address] [place] [phone], phone; [phone], fax Sees patients 1st and 3rd Saturday of every month.  Must not qualify for public or private insurance (i.e. Medicaid, Medicare, [place] Health Choice, Veterans' Benefits)  Household income should be no more than 200% of the poverty level The clinic cannot treat you if you are pregnant or think you are pregnant  Sexually transmitted diseases are not treated at the clinic.    Dental Care: Organization         Address  Phone  Notes  [facility] Department of [facility] [address] [place], [place] [phone] Accepts children up to age 36 who are enrolled in [place] or [place] Health Choice; pregnant women with a Medicaid card; and children who have applied for Medicaid or [place] Health Choice, but were declined, whose parents can pay a reduced fee at time of service.  [facility]  [address], [place] [phone] Accepts children up to age 61 who are enrolled in [place] or [place] Health Choice;  pregnant women with a Medicaid card; and children who have applied for Medicaid or [place] Health Choice, but were declined, whose parents can pay a reduced fee at time of  service.  [facility]  [address] [place], [place] [phone] Patients are seen by appointment only. Walk-ins are not accepted. [facility] will see patients 40 years of age and older. Monday - Tuesday (8am-5pm) Most Wednesdays (8:30-5pm) $30 per visit, cash only  [facility]  [address], [facility] [phone] Patients are seen by appointment only. Walk-ins are not accepted. [facility] will see patients 24 years of age and older. One Wednesday Evening (Monthly: Volunteer Based).  $30 per visit, cash only  [employer]  [phone] for adults; Children under age 50, call Graduate Pediatric Dentistry at [phone]. Children aged 67-14, please call [phone] to request a pediatric application.  Dental services are provided in all areas of dental care including fillings, crowns and bridges, complete and partial dentures, implants, gum treatment, root canals, and extractions. Preventive care is also provided. Treatment is provided to both adults and children. Patients are selected via a lottery and there is often a waiting list.   [facility] [address] [address], [place]  [phone] [email or web address]   [facility] [address] [place], [place] [phone], Ext. [phone] Second and Fourth Thursday of each month, opens at 6:30 AM; Clinic ends at 9 AM.  Patients are seen on a first-come first-served basis, and a limited number are seen during each clinic.   [facility]  [address] Ether Griffins [place], [place] [phone]   Eligibility Requirements You must have lived in [place], [place], or [place] counties for at least the last three months.   You cannot be eligible for state or federal sponsored [place], including [employer], [place], or [employer].   You generally cannot be eligible for healthcare insurance through your employer.     How to apply: Eligibility screenings are held every Tuesday and Wednesday afternoon from 1:00 pm until 4:00 pm. You do not need an appointment for the interview!  [facility] [address], [place], [place] [phone]   [facility] Health Department  [phone]   [facility] Health Department  [phone]   [facility] Health Department  [phone]    Behavioral Health Resources in the Community: Intensive Outpatient Programs Organization         Address  Phone  Notes  [facility] [address], [place], [place] [phone]   [facility] Outpatient [address] [place], [place] [phone]   [facility] [address] [place], [place]  [phone]   [facility] [address]  [place], [place] [phone] or [phone]   Substance Abuse Resources Organization         Address  Phone  Notes  [facility]  [phone]   [facility]  [phone]   [facility]  [phone]   Floydene Flock  [phone]   Residential & Outpatient Substance Abuse Program  [phone]   Psychological Services Organization         Address  Phone  Notes  [facility] Behavioral Health  [phone]   [facility] Services  [phone]   [facility] Mental Health [address], [place] [phone] or [phone]    Mobile  Crisis Teams Organization         Address  Phone  Notes  [facility], Mobile Crisis Care Unit  [phone]   [facility]  [address] [place], [place] [phone]   [facility] [address] [place] [phone]    Self-Help/Support Groups Organization         Address  Phone             Notes  Mental Health Assoc. of  - variety of support groups  336- I7437963 Call for more information  Narcotics Anonymous (NA), [facility] [address]  Dr, [employer] [place]  2 meetings at this location   Statistician         Address  Phone  Notes  [facility] [address],    [place]  [phone]   [facility]  [address] [place] [address], [place], [place] [phone]   [facility] Treatment Facility [address] [place], [place] [phone] Admissions: 8am-3pm M-F  [facility] [address],    [place], [place] [phone]   [facility] [address] [place], [place], [place] [phone]   [facility] [address]  [place], [place] [phone]   [facility] - Intensive Outpatient [address] Laurell Josephs [address], [place], [place] [phone]   [facility] ([facility]iction [facility] [address] [place]  [place], [place] [phone] or [phone]   [facility] ([facility]) [address] [place], [place] [phone] Accepts Medicaid  [facility] [address],  [place] [phone] Substance Abuse/Addiction Treatment   [facility] Organization         Address  Phone  Notes  [facility]  [phone]   Angie Fava, PhD [address] Ervin Knack [place], [place]   [phone] or [phone]   [facility] Behavioral   [address] [place], [place] [phone]   [facility] [address] [place], [place] [phone] Insurance/Medicaid/sponsorship through [facility] and Families [address]                                    [place], [place] [phone] Therapy/tele-psych/case  [facility] [address] [place], [place] [phone]    Dr. Lolly Mustache  [phone]   [facility]  [employer] [facility] Dept. 1) [address] [place] 2) [address], [place] 3)  [address], [place] [phone]  [phone]   [facility] Child Abuse  Hotline [phone] or [phone] (After Hours)

## 2016-01-29 NOTE — ED Provider Notes (Signed)
CSN: [id number]     Arrival date & time 01/29/16  1103 History  By signing my name below, I, Linna Darner, attest that this documentation has been prepared under the direction and in the presence of non-physician practitioner, Trixie Dredge, PA-C. Electronically Signed: Linna Darner, Scribe. 01/29/2016. 1:10 PM.    Chief Complaint  Patient presents with  . Shoulder Pain  . Back Pain    The history is provided by the patient. No language interpreter was used.     HPI Comments: Arthur Mcdonald is a 25 y.o. male who presents to the Emergency Department complaining of sudden onset, worsening, stabbing, back right shoulder pain beginning approximately one year ago. Pt states that he was unable to move his entire right arm a week ago. He reports that his shoulder pain radiates into his right collarbone area and that his pain is exacerbated with movement. Pt endorses pain exacerbation with palpation to back right shoulder. Pt has been applying ice and heat pads to his right shoulder without relief. He states that his shoulder issue began at the same time as his associated lower back pain; he worked for a [employer] and was smashed against a tree - he reports lower back pain and back right shoulder pain since this incident. He states that he did not break any bones during his incident at work and has not had back surgery. 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 He denies any recent injury. He has not been referred to or seen an orthopedist. Pt denies neck pain, numbness, weakness, or any other associated symptoms at this time.     Past Medical History  Diagnosis Date  . ADHD (attention deficit hyperactivity disorder)   . Bipolar disorder (HCC)   . Anxiety   . Skin infection    Past Surgical History  Procedure Laterality Date  . Birthmark removed from right side of head      Family History  Problem Relation Age of Onset  . Diabetes Other   . Heart attack Other   . Diabetes Mother    Social History  Substance Use Topics  . Smoking status: Current Every Day Smoker -- 1.00 packs/day    Types: Cigarettes  . Smokeless tobacco: None  . Alcohol Use: No    Review of Systems  Constitutional: Negative for fever.  Respiratory: Negative for shortness of breath.   Cardiovascular: Negative for chest pain.  Musculoskeletal: Positive for back pain (lower) and arthralgias (back right shoulder). Negative for neck pain.  Skin: Negative for color change.  Allergic/Immunologic: Negative for immunocompromised state.  Neurological: Negative for numbness.      Allergies  Review of patient's allergies indicates no known allergies.  Home Medications   Prior to Admission medications   Medication Sig Start Date End Date Taking? Authorizing Provider  cephALEXin (KEFLEX) 500 MG capsule Take 1 capsule (500 mg total) by mouth 4 (four) times daily. 09/02/14   Trixie Dredge, PA-C  cyclobenzaprine (FLEXERIL) 10 MG tablet Take 1 tablet (10 mg total) by mouth 2 (two) times daily as needed for muscle spasms. 11/22/15   Arthor Captain, PA-C  traMADol (ULTRAM) 50 MG tablet Take 1 tablet (50 mg total) by mouth every 6 (six) hours as needed. 11/22/15   Arthor Captain, PA-C   BP 124/74 mmHg  Pulse 79  Temp(Src) 98.3 F (36.8 C) (Oral)  Resp 16  Ht  (1.702 m)  Wt 200 lb (90.719 kg)  BMI 31.32 kg/m2  SpO2 99% Physical Exam  Constitutional: He appears well-developed and well-nourished. No distress.  HENT:  Head: Normocephalic and atraumatic.  Eyes: Conjunctivae are normal.  Neck: Normal range of motion. Neck supple.  Pulmonary/Chest: Effort normal.  Musculoskeletal:  Right trapezius tender with possible spasm Right shoulder full range of active motion without pain  No tenderness of the joint  No edema, erythema or warmth Distal pulses intact Strength 5x5 Sensation  intact RUE  Neurological: He is alert. He exhibits normal muscle tone.  Skin: He is not diaphoretic.  Nursing note and vitals reviewed.   ED Course  Procedures (including critical care time)  DIAGNOSTIC STUDIES: Oxygen Saturation is 99% on RA, normal by my interpretation.    COORDINATION OF CARE: 1:10 PM Will discharge with Meloxicam 7.5 mg and Robaxin 500 mg. Discussed treatment plan with pt at bedside and pt agreed to plan.   Labs Review Labs Reviewed - No data to display  Imaging Review No results found.    EKG Interpretation None      MDM   Final diagnoses:  Upper back pain on right side  Chronic back pain greater than 3 months duration    Afebrile, nontoxic patient with exacerbation of chronic back pain.  No red flags with history or exam.  Neurovascularly intact.  Emergent imaging not indicated at this time.   D/C home with mobic, robaxin, orthopedic referral, resources.   Discussed result, findings, treatment, and follow up  with patient.  Pt given return precautions.  Pt verbalizes understanding and agrees with plan.         I personally performed the services described in this documentation, which was scribed in my presence. The recorded information has been reviewed and is accurate.      Trixie Dredge, PA-C 01/29/16 1447  Margarita Grizzle, MD 01/29/16 [phone]

## 2016-02-27 ENCOUNTER — Encounter (HOSPITAL_COMMUNITY): Payer: Self-pay | Admitting: Emergency Medicine

## 2016-02-27 ENCOUNTER — Emergency Department (HOSPITAL_COMMUNITY): Payer: Medicaid Other

## 2016-02-27 ENCOUNTER — Emergency Department (HOSPITAL_COMMUNITY)
Admission: EM | Admit: 2016-02-27 | Discharge: 2016-02-27 | Disposition: A | Discharge status: Home / Self Care | Payer: Medicaid Other | Attending: Emergency Medicine | Admitting: Emergency Medicine

## 2016-02-27 DIAGNOSIS — Z792 Long term (current) use of antibiotics: Secondary | ICD-10-CM | POA: Insufficient documentation

## 2016-02-27 DIAGNOSIS — Y9289 Other specified places as the place of occurrence of the external cause: Secondary | ICD-10-CM | POA: Insufficient documentation

## 2016-02-27 DIAGNOSIS — Z872 Personal history of diseases of the skin and subcutaneous tissue: Secondary | ICD-10-CM | POA: Diagnosis not present

## 2016-02-27 DIAGNOSIS — F419 Anxiety disorder, unspecified: Secondary | ICD-10-CM | POA: Insufficient documentation

## 2016-02-27 DIAGNOSIS — F17210 Nicotine dependence, cigarettes, uncomplicated: Secondary | ICD-10-CM | POA: Insufficient documentation

## 2016-02-27 DIAGNOSIS — S99912A Unspecified injury of left ankle, initial encounter: Secondary | ICD-10-CM | POA: Diagnosis present

## 2016-02-27 DIAGNOSIS — X509XXA Other and unspecified overexertion or strenuous movements or postures, initial encounter: Secondary | ICD-10-CM | POA: Diagnosis not present

## 2016-02-27 DIAGNOSIS — Y9389 Activity, other specified: Secondary | ICD-10-CM | POA: Insufficient documentation

## 2016-02-27 DIAGNOSIS — S93402A Sprain of unspecified ligament of left ankle, initial encounter: Secondary | ICD-10-CM | POA: Insufficient documentation

## 2016-02-27 DIAGNOSIS — Y998 Other external cause status: Secondary | ICD-10-CM | POA: Insufficient documentation

## 2016-02-27 MED ORDER — NAPROXEN 500 MG PO TABS: 500.0000 mg | ORAL_TABLET | Freq: Two times a day (BID) | ORAL | Status: DC

## 2016-02-27 NOTE — ED Provider Notes (Signed)
CSN: [id number]     Arrival date & time 02/27/16  1003 History  By signing my name below, I, Arthur Mcdonald, attest that this documentation has been prepared under the direction and in the presence of non-physician practitioner, Arthor Captain, PA-C. Electronically Signed: Freida Mcdonald, Scribe. 02/27/2016. 11:48 AM.    Chief Complaint  Patient presents with  . Ankle Pain    The history is provided by the patient. No language interpreter was used.    HPI Comments:  Arthur Mcdonald is a 25 y.o. male who presents to the Emergency Department complaining of moderate constant left ankle pain s/p injury last night. Pt states he missed a step, rolled the ankle and felt a pop and crack. He has been unable to bear weight on the extremity. Pt reports associated swelling at site. He has taken ibuprofen without relief. Tetanus is UTD. Pt has no other complaints or symptoms at this time.   Past Medical History  Diagnosis Date  . ADHD (attention deficit hyperactivity disorder)   . Bipolar disorder (HCC)   . Anxiety   . Skin infection    Past Surgical History  Procedure Laterality Date  . 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 Diabetes Other   . Heart attack Other   . Diabetes Mother    Social History  Substance Use Topics  . Smoking status: Current Every Day Smoker -- 1.00 packs/day    Types: Cigarettes  . Smokeless tobacco: None  . Alcohol Use: No    Review of Systems  Musculoskeletal: Positive for myalgias, joint swelling and arthralgias.  Neurological: Negative for weakness.   Allergies  Review of patient's allergies indicates no known allergies.  Home Medications   Prior to Admission medications   Medication Sig Start Date End Date Taking? Authorizing Provider  cephALEXin (KEFLEX) 500 MG capsule Take 1 capsule (500 mg total) by mouth 4 (four) times daily. 09/02/14   Trixie Dredge, PA-C  cyclobenzaprine (FLEXERIL) 10 MG tablet  Take 1 tablet (10 mg total) by mouth 2 (two) times daily as needed for muscle spasms. 11/22/15   Arthor Captain, PA-C  meloxicam (MOBIC) 7.5 MG tablet Take 1 tablet (7.5 mg total) by mouth daily as needed for pain. 01/29/16   Trixie Dredge, PA-C  methocarbamol (ROBAXIN) 500 MG tablet Take 1-2 tablets (500-1,000 mg total) by mouth every 6 (six) hours as needed for muscle spasms. 01/29/16   Trixie Dredge, PA-C  naproxen (NAPROSYN) 500 MG tablet Take 1 tablet (500 mg total) by mouth 2 (two) times daily with a meal. 02/27/16   Arthor Captain, PA-C  traMADol (ULTRAM) 50 MG tablet Take 1 tablet (50 mg total) by mouth every 6 (six) hours as needed. 11/22/15   Tahja Liao, PA-C   BP 123/57 mmHg  Pulse 79  Temp(Src) 97.4 F (36.3 C) (Oral)  Resp 16  SpO2 98% Physical Exam  Constitutional: He is oriented to person, place, and time. He appears well-developed and well-nourished. No distress.  HENT:  Head: Normocephalic and atraumatic.  Eyes: Conjunctivae are normal.  Cardiovascular: Normal rate.   Pulmonary/Chest: Effort normal.  Abdominal: He exhibits no distension.  Musculoskeletal:  Tenderness and swelling of the proximal aspect of the left lateral foot near the lateral malleolus with  minimal bruising  2 single 1 cm abrasions  Strong distal pulses  Sensation intact   Neurological: He is alert and oriented to person, place, and time.  Skin: Skin is warm and dry.  Psychiatric: He has a normal mood and affect.  Nursing note and vitals reviewed.   ED Course  Procedures  DIAGNOSTIC STUDIES:  Oxygen Saturation is 98% on RA, normal by my interpretation.    COORDINATION OF CARE:  11:26 AM Discussed treatment plan with pt at bedside and pt agreed to plan.  Imaging Review Dg Ankle Complete Left  02/27/2016  CLINICAL DATA:  Patient states was walking the dog and he stepped out onto step, but missed the step. Patient states it rolled inward and he "heard a pop". Patient states pain along the lateral  aspect of left ankle. EXAM: LEFT ANKLE COMPLETE - 3+ VIEW COMPARISON:  07/02/2010 FINDINGS: Ankle mortise intact. The talar dome is normal. No malleolar fracture. The calcaneus is normal. IMPRESSION: No fracture or dislocation. Electronically Signed   By: Genevive BiStewart  Edmunds M.D.   On: 02/27/2016 11:42   I have personally reviewed and evaluated these images and lab results as part of my medical decision-making.  MDM   Patient X-Ray negative for obvious fracture or dislocation.  Pt advised to follow up with orthopedics. Patient given ASO splint while in ED, conservative therapy recommended and discussed. Patient will be discharged home & is agreeable with above plan. Returns precautions discussed. Pt appears safe for discharge.  Final diagnoses:  Ankle sprain, left, initial encounter      I personally performed the services described in this documentation, which was scribed in my presence. The recorded information has been reviewed and is accurate.       Arthor Captainbigail Aybree Lanyon, PA-C 02/27/16 1150  Laurence Spatesachel Morgan Little, MD 02/27/16 [phone]

## 2016-02-27 NOTE — ED Notes (Signed)
Patient states was walking the dog and he stepped out onto step, but missed the step.  Patient states it rolled inward and he "heard a pop".  Patient states pain in left ankle. Patient states took ibuprofen for the pain at home.

## 2016-02-27 NOTE — Discharge Instructions (Signed)
Ankle Sprain °An ankle sprain is an injury to the strong, fibrous tissues (ligaments) that hold the bones of your ankle joint together.  °CAUSES °An ankle sprain is usually caused by a fall or by twisting your ankle. Ankle sprains most commonly occur when you step on the outer edge of your foot, and your ankle turns inward. People who participate in sports are more prone to these types of injuries.  °SYMPTOMS  °· Pain in your ankle. The pain may be present at rest or only when you are trying to stand or walk. °· Swelling. °· Bruising. Bruising may develop immediately or within 1 to 2 days after your injury. °· Difficulty standing or walking, particularly when turning corners or changing directions. °DIAGNOSIS  °Your caregiver will ask you details about your injury and perform a physical exam of your ankle to determine if you have an ankle sprain. During the physical exam, your caregiver will press on and apply pressure to specific areas of your foot and ankle. Your caregiver will try to move your ankle in certain ways. An X-ray exam may be done to be sure a bone was not broken or a ligament did not separate from one of the bones in your ankle (avulsion fracture).  °TREATMENT  °Certain types of braces can help stabilize your ankle. Your caregiver can make a recommendation for this. Your caregiver may recommend the use of medicine for pain. If your sprain is severe, your caregiver may refer you to a surgeon who helps to restore function to parts of your skeletal system (orthopedist) or a physical therapist. °HOME CARE INSTRUCTIONS  °· Apply ice to your injury for 1-2 days or as directed by your caregiver. Applying ice helps to reduce inflammation and pain. °· Put ice in a plastic bag. °· Place a towel between your skin and the bag. °· Leave the ice on for 15-20 minutes at a time, every 2 hours while you are awake. °· Only take over-the-counter or prescription medicines for pain, discomfort, or fever as directed by  your caregiver. °· Elevate your injured ankle above the level of your heart as much as possible for 2-3 days. °· If your caregiver recommends crutches, use them as instructed. Gradually put weight on the affected ankle. Continue to use crutches or a cane until you can walk without feeling pain in your ankle. °· If you have a plaster splint, wear the splint as directed by your caregiver. Do not rest it on anything harder than a pillow for the first 24 hours. Do not put weight on it. Do not get it wet. You may take it off to take a shower or bath. °· You may have been given an elastic bandage to wear around your ankle to provide support. If the elastic bandage is too tight (you have numbness or tingling in your foot or your foot becomes cold and blue), adjust the bandage to make it comfortable. °· If you have an air splint, you may blow more air into it or let air out to make it more comfortable. You may take your splint off at night and before taking a shower or bath. Wiggle your toes in the splint several times per day to decrease swelling. °SEEK MEDICAL CARE IF:  °· You have rapidly increasing bruising or swelling. °· Your toes feel extremely cold or you lose feeling in your foot. °· Your pain is not relieved with medicine. °SEEK IMMEDIATE MEDICAL CARE IF: °· Your toes are numb or blue. °·   You have severe pain that is increasing. °MAKE SURE YOU:  °· Understand these instructions. °· Will watch your condition. °· Will get help right away if you are not doing well or get worse. °  °This information is not intended to replace advice given to you by your health care provider. Make sure you discuss any questions you have with your health care provider. °  °Document Released: 11/25/2005 Document Revised: 12/16/2014 Document Reviewed: 12/07/2011 °Elsevier Interactive Patient Education ©2016 [employer] ° °Cryotherapy °Cryotherapy means treatment with cold. Ice or gel packs can be used to reduce both pain and swelling.  Ice is the most helpful within the first 24 to 48 hours after an injury or flare-up from overusing a muscle or joint. Sprains, strains, spasms, burning pain, shooting pain, and aches can all be eased with ice. Ice can also be used when recovering from surgery. Ice is effective, has very few side effects, and is safe for most people to use. °PRECAUTIONS  °Ice is not a safe treatment option for people with: °· Raynaud phenomenon. This is a condition affecting small blood vessels in the extremities. Exposure to cold may cause your problems to return. °· Cold hypersensitivity. There are many forms of cold hypersensitivity, including: °¨ Cold urticaria. Red, itchy hives appear on the skin when the tissues begin to warm after being iced. °¨ Cold erythema. This is a red, itchy rash caused by exposure to cold. °¨ Cold hemoglobinuria. Red blood cells break down when the tissues begin to warm after being iced. The hemoglobin that carry oxygen are passed into the urine because they cannot combine with blood proteins fast enough. °· Numbness or altered sensitivity in the area being iced. °If you have any of the following conditions, do not use ice until you have discussed cryotherapy with your caregiver: °· Heart conditions, such as arrhythmia, angina, or chronic heart disease. °· High blood pressure. °· Healing wounds or open skin in the area being iced. °· Current infections. °· Rheumatoid arthritis. °· Poor circulation. °· Diabetes. °Ice slows the blood flow in the region it is applied. This is beneficial when trying to stop inflamed tissues from spreading irritating chemicals to surrounding tissues. However, if you expose your skin to cold temperatures for too long or without the proper protection, you can damage your skin or nerves. Watch for signs of skin damage due to cold. °HOME CARE INSTRUCTIONS °Follow these tips to use ice and cold packs safely. °· Place a dry or damp towel between the ice and skin. A damp towel will  cool the skin more quickly, so you may need to shorten the time that the ice is used. °· For a more rapid response, add gentle compression to the ice. °· Ice for no more than 10 to 20 minutes at a time. The bonier the area you are icing, the less time it will take to get the benefits of ice. °· Check your skin after 5 minutes to make sure there are no signs of a poor response to cold or skin damage. °· Rest 20 minutes or more between uses. °· Once your skin is numb, you can end your treatment. You can test numbness by very lightly touching your skin. The touch should be so light that you do not see the skin dimple from the pressure of your fingertip. When using ice, most people will feel these normal sensations in this order: cold, burning, aching, and numbness. °· Do not use ice on someone who   cannot communicate their responses to pain, such as small children or people with dementia. °HOW TO MAKE AN ICE PACK °Ice packs are the most common way to use ice therapy. Other methods include ice massage, ice baths, and cryosprays. Muscle creams that cause a cold, tingly feeling do not offer the same benefits that ice offers and should not be used as a substitute unless recommended by your caregiver. °To make an ice pack, do one of the following: °· Place crushed ice or a bag of frozen vegetables in a sealable plastic bag. Squeeze out the excess air. Place this bag inside another plastic bag. Slide the bag into a pillowcase or place a damp towel between your skin and the bag. °· Mix 3 parts water with 1 part rubbing alcohol. Freeze the mixture in a sealable plastic bag. When you remove the mixture from the freezer, it will be slushy. Squeeze out the excess air. Place this bag inside another plastic bag. Slide the bag into a pillowcase or place a damp towel between your skin and the bag. °SEEK MEDICAL CARE IF: °· You develop white spots on your skin. This may give the skin a blotchy (mottled) appearance. °· Your skin turns  blue or pale. °· Your skin becomes waxy or hard. °· Your swelling gets worse. °MAKE SURE YOU:  °· Understand these instructions. °· Will watch your condition. °· Will get help right away if you are not doing well or get worse. °  °This information is not intended to replace advice given to you by your health care provider. Make sure you discuss any questions you have with your health care provider. °  °Document Released: 07/22/2011 Document Revised: 12/16/2014 Document Reviewed: 07/22/2011 °Elsevier Interactive Patient Education ©2016 [employer] ° °

## 2016-11-04 ENCOUNTER — Encounter (HOSPITAL_COMMUNITY): Payer: Self-pay | Admitting: Emergency Medicine

## 2016-11-04 ENCOUNTER — Emergency Department (HOSPITAL_COMMUNITY)
Admission: EM | Admit: 2016-11-04 | Discharge: 2016-11-04 | Disposition: A | Discharge status: Home / Self Care | Payer: Medicaid Other | Attending: Emergency Medicine | Admitting: Emergency Medicine

## 2016-11-04 ENCOUNTER — Emergency Department (HOSPITAL_COMMUNITY): Payer: Medicaid Other

## 2016-11-04 DIAGNOSIS — M546 Pain in thoracic spine: Secondary | ICD-10-CM | POA: Diagnosis not present

## 2016-11-04 DIAGNOSIS — Y929 Unspecified place or not applicable: Secondary | ICD-10-CM | POA: Diagnosis not present

## 2016-11-04 DIAGNOSIS — X500XXA Overexertion from strenuous movement or load, initial encounter: Secondary | ICD-10-CM | POA: Diagnosis not present

## 2016-11-04 DIAGNOSIS — F909 Attention-deficit hyperactivity disorder, unspecified type: Secondary | ICD-10-CM | POA: Diagnosis not present

## 2016-11-04 DIAGNOSIS — Y939 Activity, unspecified: Secondary | ICD-10-CM | POA: Diagnosis not present

## 2016-11-04 DIAGNOSIS — Y990 Civilian activity done for income or pay: Secondary | ICD-10-CM | POA: Diagnosis not present

## 2016-11-04 DIAGNOSIS — F17210 Nicotine dependence, cigarettes, uncomplicated: Secondary | ICD-10-CM | POA: Diagnosis not present

## 2016-11-04 DIAGNOSIS — M549 Dorsalgia, unspecified: Secondary | ICD-10-CM | POA: Diagnosis present

## 2016-11-04 MED ORDER — KETOROLAC TROMETHAMINE 30 MG/ML IJ SOLN
60.0000 mg | Freq: Once | INTRAMUSCULAR | Status: AC
  Administered 2016-11-04: 60 mg via INTRAMUSCULAR
  Filled 2016-11-04: qty 2

## 2016-11-04 MED ORDER — NAPROXEN 500 MG PO TABS: 500.0000 mg | ORAL_TABLET | Freq: Two times a day (BID) | ORAL | 0 refills | Status: DC

## 2016-11-04 MED ORDER — CYCLOBENZAPRINE HCL 10 MG PO TABS
10.0000 mg | ORAL_TABLET | Freq: Once | ORAL | Status: AC
  Administered 2016-11-04: 10 mg via ORAL
  Filled 2016-11-04: qty 1

## 2016-11-04 MED ORDER — CYCLOBENZAPRINE HCL 5 MG PO TABS: 5.0000 mg | ORAL_TABLET | Freq: Three times a day (TID) | ORAL | 0 refills | Status: DC | PRN

## 2016-11-04 NOTE — Discharge Instructions (Signed)
Read the information below.  Your x-rays were re-assuring.  I have prescribed naprosyn and flexeril for relief. While taking naprosyn do not take other NSAIDs (ibuprofen, motrin, or aleve). Flexeril can make you drowsy, do not drive after taking.  You can apply heat/ice to affected areas for 20 minute increments.  If symptoms persist for more than a week follow up with your primary provider.  I have also provided the contact information for orthopedics, please call if symptoms persist.  Use the prescribed medication as directed.  Please discuss all new medications with your pharmacist.   You may return to the Emergency Department at any time for worsening condition or any new symptoms that concern you. Return if you develop fever, numbness, weakness, loss of bowel or bladder function, or any other new/concerning symptoms.

## 2016-11-04 NOTE — ED Notes (Signed)
Patient transported to X-ray 

## 2016-11-04 NOTE — ED Triage Notes (Signed)
Pt. reports left back pain after heavy lifting at work last Saturday , pain increases with movement/changing positions .

## 2016-11-04 NOTE — ED Provider Notes (Signed)
[facility]-EMERGENCY DEPT Provider Note   CSN: [id number] Arrival date & time: 11/04/16  1902  By signing my name below, I, Rosario AdieWilliam Andrew Hiatt, attest that this documentation has been prepared under the direction and in the presence of Arvilla MeresAshley Aubrey Voong, PA-C.  Electronically Signed: Rosario AdieWilliam Andrew Hiatt, ED Scribe. 11/04/16. 9:11 PM.  History   Chief Complaint Chief Complaint  Patient presents with  . Back Pain   The history is provided by the patient. No language interpreter was used.    HPI Comments: Arthur Mcdonald is a 25 y.o. male who presents to the Emergency Department complaining of constant, acute on chronic left-sided lumbar back pain onset approximately 3 days ago. No radiation of pain. He describes his pain as stinging, dull, and cramping. Pt reports that he has a h/o chronic back pain, however, he was lifting heavy boxes ~3 days ago which exacerbated his chronic pain. During this lifting he states that he heard a sudden "pop". Pt notes that his pain today is typical and consistent with his typical back pain flare-ups. He attempted taking Tylenol two days ago with minimal relief. Pt's pain is exacerbated with movement or changing positions, and he notes that during these episodes of exacerbation that his pain is more sharp in nature. No h/o cancer or IVDU. No recent epidural or back injections otherwise. Pt has never previously been followed by a Neurologist/Neurosurgeon/orthopedic for his h/o chronic back pain. Denies fever, saddle anaesthesia/paraesthesias, numbness, weakness, bowel/bladder incontinence, SOB, chest pain/tightness, rash, dysuria, hematuria, trouble swallowing, or any other associated symptoms.   Past Medical History:  Diagnosis Date  . ADHD (attention deficit hyperactivity disorder)   . Anxiety   . Bipolar disorder (HCC)   . Skin infection    There are no active problems to display for this patient.  Past Surgical History:  Procedure Laterality Date  . Birthmark  removed from right side of head      Home Medications    Prior to Admission medications   Medication Sig Start Date End Date Taking? Authorizing Provider  cephALEXin (KEFLEX) 500 MG capsule Take 1 capsule (500 mg total) by mouth 4 (four) times daily. 09/02/14   Trixie DredgeEmily West, PA-C  cyclobenzaprine (FLEXERIL) 5 MG tablet Take 1 tablet (5 mg total) by mouth 3 (three) times daily as needed. 11/04/16   Lona KettleAshley Laurel Machai Desmith, PA-C  naproxen (NAPROSYN) 500 MG tablet Take 1 tablet (500 mg total) by mouth 2 (two) times daily. 11/04/16   Lona KettleAshley Laurel Hermelinda Diegel, PA-C  traMADol (ULTRAM) 50 MG tablet Take 1 tablet (50 mg total) by mouth every 6 (six) hours as needed. 11/22/15   Arthor CaptainAbigail Harris, PA-C   Family History Family History  Problem Relation Age of Onset  . Diabetes Other   . Heart attack Other   . Diabetes Mother    Social History Social History  Substance Use Topics  . Smoking status: Current Every Day Smoker    Packs/day: 0.00    Types: Cigarettes  . Smokeless tobacco: Never Used  . Alcohol use No   Allergies   Patient has no known allergies.  Review of Systems Review of Systems  Constitutional: Negative for fever.  HENT: Negative for trouble swallowing.   Respiratory: Negative for chest tightness and shortness of breath.   Cardiovascular: Negative for chest pain.  Genitourinary: Negative for dysuria and hematuria.  Musculoskeletal: Positive for back pain (left-sided ) and myalgias.  Skin: Negative for rash.  Neurological: Negative for weakness and numbness.  Negative for saddle anaesthesia/paraesthesias. Negative for bowel/bladder incontinence.    Physical Exam Updated Vital Signs BP 140/66 (BP Location: Right Arm)   Pulse 83   Temp 97.9 F (36.6 C) (Oral)   Resp 18   Ht 5\' 6"  (1.676 m)   Wt 200 lb (90.7 kg)   SpO2 99%   BMI 32.28 kg/m   Physical Exam  Constitutional: He appears well-developed and well-nourished. No distress.  HENT:  Head: Normocephalic and  atraumatic.  Right Ear: External ear normal.  Left Ear: External ear normal.  Nose: Nose normal.  Mouth/Throat: Oropharynx is clear and moist. No oropharyngeal exudate.  Eyes: Conjunctivae and EOM are normal. Pupils are equal, round, and reactive to light. No scleral icterus.  Neck: Normal range of motion.  Pulmonary/Chest: Effort normal. No respiratory distress.  Musculoskeletal:  No midline spinal tenderness. Tenderness along the posterior aspect of the lower ribs on the left side.   Neurological: He is alert. He is not disoriented. He displays normal reflexes. No cranial nerve deficit or sensory deficit. He exhibits normal muscle tone. Coordination and gait normal. GCS eye subscore is 4. GCS verbal subscore is 5. GCS motor subscore is 6.  Cranial Nerves:  II: Pupils equal, round, reactive to light III,IV, VI: ptosis not present, extra-ocular motions intact bilaterally  V,VII: smile symmetric, facial light touch sensation equal VIII: hearing grossly normal bilaterally  IX,X: midline uvula rise  XI: bilateral shoulder shrug equal and strong XII: midline tongue extension  Skin: Skin is warm and dry. No rash noted. He is not diaphoretic.  Psychiatric: He has a normal mood and affect. His behavior is normal.   ED Treatments / Results  DIAGNOSTIC STUDIES: Oxygen Saturation is 99% on RA, normal by my interpretation.   COORDINATION OF CARE: 9:11 PM-Discussed next steps with pt. Pt verbalized understanding and is agreeable with the plan.   Labs (all labs ordered are listed, but only abnormal results are displayed) Labs Reviewed - No data to display  EKG  EKG Interpretation None      Radiology Dg Ribs Unilateral W/chest Left  Result Date: 11/04/2016 CLINICAL DATA:  Posterior, inferior and left-sided rib pain EXAM: LEFT RIBS AND CHEST - 3+ VIEW COMPARISON:  10/11/2014 FINDINGS: Single-view chest demonstrates no acute consolidation or effusion. Normal heart size. No pneumothorax.  Dedicated left rib series demonstrates no acute displaced left rib fracture. IMPRESSION: 1. Single-view chest is within normal limits. 2. No acute displaced left rib fracture identified Electronically Signed   By: Jasmine PangKim  Fujinaga M.D.   On: 11/04/2016 21:46    Procedures Procedures   Medications Ordered in ED Medications  ketorolac (TORADOL) 30 MG/ML injection 60 mg (60 mg Intramuscular Given 11/04/16 2123)  cyclobenzaprine (FLEXERIL) tablet 10 mg (10 mg Oral Given 11/04/16 2122)    Initial Impression / Assessment and Plan / ED Course  I have reviewed the triage vital signs and the nursing notes.  Pertinent labs & imaging results that were available during my care of the patient were reviewed by me and considered in my medical decision making (see chart for details).  Clinical Course as of Nov 04 2222  Mon Nov 04, 2016  2210 Pain is significantly improved after treatment.   [WH]  2211 Resp: 18 [WH]  2213 Reviewed XR, normal cardiac silhouette. No focal consolidations, PNA, PTX, abnormal organ findings, or other bony pathologies.  [WH]    Clinical Course User Index [WH] Lucia EstelleWilliam Hiatt   Patient is a 25yo male with a  hx of chronic back pain who presents to the ED with back pain x 3 days. Patient is afebrile and non-toxic appearing in NAD. VSS. No neurological deficits and normal neuro exam.  Patient can walk but states is painful.  No loss of bowel or bladder control.  Low suspicion for cauda equina.  No fever, h/o cancer, IVDU.  Pain improved with treatment in ED. RICE protocol and pain medicine indicated and discussed with patient. Rx naprosyn and flexeril. Follow up with PCP or ortho if sxs persist. Return precautions given. Pt voiced understanding and is agreeable.    Final Clinical Impressions(s) / ED Diagnoses   Final diagnoses:  Acute left-sided thoracic back pain   New Prescriptions New Prescriptions   CYCLOBENZAPRINE (FLEXERIL) 5 MG TABLET    Take 1 tablet (5 mg total) by mouth  3 (three) times daily as needed.   NAPROXEN (NAPROSYN) 500 MG TABLET    Take 1 tablet (500 mg total) by mouth 2 (two) times daily.   I personally performed the services described in this documentation, which was scribed in my presence. The recorded information has been reviewed and is accurate.     Lona Kettle, [place] 11/04/16 2223    Raeford Razor, MD 11/12/16 [phone]

## 2017-03-10 ENCOUNTER — Emergency Department (HOSPITAL_COMMUNITY)
Admission: EM | Admit: 2017-03-10 | Discharge: 2017-03-10 | Disposition: A | Discharge status: Home / Self Care | Payer: Medicaid Other | Attending: Emergency Medicine | Admitting: Emergency Medicine

## 2017-03-10 ENCOUNTER — Encounter (HOSPITAL_COMMUNITY): Payer: Self-pay

## 2017-03-10 DIAGNOSIS — F17210 Nicotine dependence, cigarettes, uncomplicated: Secondary | ICD-10-CM | POA: Insufficient documentation

## 2017-03-10 DIAGNOSIS — Z79899 Other long term (current) drug therapy: Secondary | ICD-10-CM | POA: Insufficient documentation

## 2017-03-10 DIAGNOSIS — B070 Plantar wart: Secondary | ICD-10-CM | POA: Insufficient documentation

## 2017-03-10 MED ORDER — SALICYLIC ACID 6 % EX GEL: Freq: Every day | CUTANEOUS | 0 refills | Status: AC

## 2017-03-10 NOTE — Discharge Instructions (Signed)
Take ibuprofen and tylenol for pain Apply salicylic acid cream to wart daily. Follow-up with podiatry. Referral provided

## 2017-03-10 NOTE — ED Notes (Signed)
Pt has possible wart on lateral side of right foot. Pt has had this for lasy 2 months. Pt states it is a pulsating 3/10 pain. Hurts more with ambulation. Pt in pain with ambulation.

## 2017-03-10 NOTE — ED Triage Notes (Signed)
Pt has wart on the bottom of the foot. He reports it has been there X2 months. Pt states it is painful to put pressure on.

## 2017-03-10 NOTE — ED Notes (Signed)
Pt verbalized understanding of d/c instructions and has no further questions. Pt is stable, A&Ox4, VSS.  

## 2017-03-10 NOTE — ED Provider Notes (Signed)
[facility] DEPT Provider Note   CSN: [id number] Arrival date & time: 03/10/17  1826     History   Chief Complaint Chief Complaint  Patient presents with  . Plantar Warts    HPI Arthur Mcdonald is a 26 y.o. male.  HPI 26 year old male who presents with a plantar wart to the right foot. This is been ongoing for 2 months. Reports pain with ambulation and palpation. No trauma or injury to the foot. Denies any poor fitting oral comfortable shoes. No fevers or chills. No numbness or weakness. Has not tried any treatments for this. No alleviating factors.   Past Medical History:  Diagnosis Date  . ADHD (attention deficit hyperactivity disorder)   . Anxiety   . Bipolar disorder (HCC)   . Skin infection     There are no active problems to display for this patient.   Past Surgical History:  Procedure Laterality Date  . Birthmark removed from right side of head         Home Medications    Prior to Admission medications   Medication Sig Start Date End Date Taking? Authorizing Provider  cephALEXin (KEFLEX) 500 MG capsule Take 1 capsule (500 mg total) by mouth 4 (four) times daily. 09/02/14   Trixie Dredge, PA-C  cyclobenzaprine (FLEXERIL) 5 MG tablet Take 1 tablet (5 mg total) by mouth 3 (three) times daily as needed. 11/04/16   Deborha Payment, PA-C  naproxen (NAPROSYN) 500 MG tablet Take 1 tablet (500 mg total) by mouth 2 (two) times daily. 11/04/16   Deborha Payment, PA-C  salicylic acid 6 % gel Apply topically daily. Apply to wart 03/10/17   Lavera Guise, MD  traMADol (ULTRAM) 50 MG tablet Take 1 tablet (50 mg total) by mouth every 6 (six) hours as needed. 11/22/15   Arthor Captain, PA-C    Family History Family History  Problem Relation Age of Onset  . Diabetes Other   . Heart attack Other   . Diabetes Mother     Social History Social History  Substance Use Topics  . Smoking status: Current Every Day Smoker    Packs/day: 0.00    Types: Cigarettes  . Smokeless  tobacco: Never Used  . Alcohol use No     Allergies   Patient has no known allergies.   Review of Systems Review of Systems  Constitutional: Negative for fever.  Skin: Positive for wound.  Allergic/Immunologic: Negative for immunocompromised state.  Neurological: Negative for weakness and numbness.  Hematological: Does not bruise/bleed easily.  All other systems reviewed and are negative.    Physical Exam Updated Vital Signs BP 126/77 (BP Location: Left Arm)   Pulse (!) 114   Temp 98.8 F (37.1 C) (Oral)   Resp 20   Ht  (1.702 m)   Wt 220 lb (99.8 kg)   SpO2 97%   BMI 34.46 kg/m   Physical Exam Physical Exam  Constitutional: He appears well-developed and well-nourished.  HENT:  Head: Normocephalic.  Eyes: Conjunctivae are normal.  Cardiovascular: Normal rate. +2 DP pulses bilatearlly   Pulmonary/Chest: Effort normal. No respiratory distress.  Abdominal: He exhibits no distension.  Musculoskeletal: Normal range of motion. He exhibits no deformity.  Neurological: He is alert.  Skin: Skin is warm and dry. there is plantar war over the medial aspect of the right foot.  Psychiatric: He has a normal mood and affect. His behavior is normal.  Nursing note and vitals reviewed.   ED Treatments /  Results  Labs (all labs ordered are listed, but only abnormal results are displayed) Labs Reviewed - No data to display  EKG  EKG Interpretation None       Radiology No results found.  Procedures Procedures (including critical care time)  Medications Ordered in ED Medications - No data to display   Initial Impression / Assessment and Plan / ED Course  I have reviewed the triage vital signs and the nursing notes.  Pertinent labs & imaging results that were available during my care of the patient were reviewed by me and considered in my medical decision making (see chart for details).     He has a plantar wart to the right foot. Extremities  neurovascularly intact, and no signs of infection. I prescribed salicylic acid cream. Podiatrist referral given. Strict return and follow-up instructions reviewed. He expressed understanding of all discharge instructions and felt comfortable with the plan of care.   Final Clinical Impressions(s) / ED Diagnoses   Final diagnoses:  Plantar wart of right foot    New Prescriptions New Prescriptions   SALICYLIC ACID 6 % GEL    Apply topically daily. Apply to wart     Lavera Guise, MD 03/10/17 2115

## 2017-03-27 ENCOUNTER — Ambulatory Visit: Payer: Medicaid Other | Admitting: Podiatry

## 2017-04-21 ENCOUNTER — Emergency Department (HOSPITAL_COMMUNITY)
Admission: EM | Admit: 2017-04-21 | Discharge: 2017-04-21 | Disposition: A | Discharge status: Home / Self Care | Payer: Medicaid Other | Attending: Emergency Medicine | Admitting: Emergency Medicine

## 2017-04-21 ENCOUNTER — Emergency Department (HOSPITAL_COMMUNITY): Payer: Medicaid Other

## 2017-04-21 ENCOUNTER — Encounter (HOSPITAL_COMMUNITY): Payer: Self-pay | Admitting: Emergency Medicine

## 2017-04-21 DIAGNOSIS — R0602 Shortness of breath: Secondary | ICD-10-CM | POA: Insufficient documentation

## 2017-04-21 DIAGNOSIS — Z5321 Procedure and treatment not carried out due to patient leaving prior to being seen by health care provider: Secondary | ICD-10-CM | POA: Diagnosis not present

## 2017-04-21 DIAGNOSIS — F17210 Nicotine dependence, cigarettes, uncomplicated: Secondary | ICD-10-CM | POA: Insufficient documentation

## 2017-04-21 DIAGNOSIS — F909 Attention-deficit hyperactivity disorder, unspecified type: Secondary | ICD-10-CM | POA: Insufficient documentation

## 2017-04-21 LAB — CBC
HEMATOCRIT: 49.3 % (ref 39.0–52.0)
Hemoglobin: 17.5 g/dL — ABNORMAL HIGH (ref 13.0–17.0)
MCH: 31.5 pg (ref 26.0–34.0)
MCHC: 35.5 g/dL (ref 30.0–36.0)
MCV: 88.8 fL (ref 78.0–100.0)
PLATELETS: 209 10*3/uL (ref 150–400)
RBC: 5.55 MIL/uL (ref 4.22–5.81)
RDW: 12.9 % (ref 11.5–15.5)
WBC: 15.5 10*3/uL — AB (ref 4.0–10.5)

## 2017-04-21 LAB — I-STAT TROPONIN, ED: Troponin i, poc: 0 ng/mL (ref 0.00–0.08)

## 2017-04-21 LAB — BASIC METABOLIC PANEL
Anion gap: 9 (ref 5–15)
BUN: 6 mg/dL (ref 6–20)
CALCIUM: 9.3 mg/dL (ref 8.9–10.3)
CO2: 25 mmol/L (ref 22–32)
CREATININE: 1.25 mg/dL — AB (ref 0.61–1.24)
Chloride: 106 mmol/L (ref 101–111)
GFR calc Af Amer: >60 mL/min (ref >60)
Glucose, Bld: 97 mg/dL (ref 65–99)
Potassium: 4 mmol/L (ref 3.5–5.1)
Sodium: 140 mmol/L (ref 135–145)

## 2017-04-21 NOTE — ED Notes (Signed)
Pt states he is no longer able to wait; Pt seen leaving Ed

## 2017-04-21 NOTE — ED Triage Notes (Signed)
Pt presents to ED for assessment of 1 week of URI symptoms, 1 pack a day smoker.  Pt now c/o shortness of breath with coughing and intermittent chest pain.  C/o some headache with dizziness when he coughs really hard.

## 2018-01-27 ENCOUNTER — Other Ambulatory Visit: Payer: Self-pay

## 2018-01-27 ENCOUNTER — Emergency Department (HOSPITAL_COMMUNITY): Payer: Medicaid Other

## 2018-01-27 ENCOUNTER — Encounter (HOSPITAL_COMMUNITY): Payer: Self-pay

## 2018-01-27 ENCOUNTER — Emergency Department (HOSPITAL_COMMUNITY)
Admission: EM | Admit: 2018-01-27 | Discharge: 2018-01-27 | Disposition: A | Discharge status: Home / Self Care | Payer: Medicaid Other | Attending: Emergency Medicine | Admitting: Emergency Medicine

## 2018-01-27 DIAGNOSIS — R079 Chest pain, unspecified: Secondary | ICD-10-CM | POA: Diagnosis not present

## 2018-01-27 DIAGNOSIS — F17210 Nicotine dependence, cigarettes, uncomplicated: Secondary | ICD-10-CM | POA: Diagnosis not present

## 2018-01-27 DIAGNOSIS — D72829 Elevated white blood cell count, unspecified: Secondary | ICD-10-CM | POA: Diagnosis not present

## 2018-01-27 LAB — COMPREHENSIVE METABOLIC PANEL
ALT: 49 U/L (ref 17–63)
AST: 36 U/L (ref 15–41)
Albumin: 4.2 g/dL (ref 3.5–5.0)
Alkaline Phosphatase: 112 U/L (ref 38–126)
Anion gap: 13 (ref 5–15)
BUN: 11 mg/dL (ref 6–20)
CHLORIDE: 106 mmol/L (ref 101–111)
CO2: 22 mmol/L (ref 22–32)
Calcium: 9.3 mg/dL (ref 8.9–10.3)
Creatinine, Ser: 1.14 mg/dL (ref 0.61–1.24)
GFR calc non Af Amer: >60 mL/min (ref >60)
Glucose, Bld: 99 mg/dL (ref 65–99)
Potassium: 4 mmol/L (ref 3.5–5.1)
SODIUM: 141 mmol/L (ref 135–145)
Total Bilirubin: 1.3 mg/dL — ABNORMAL HIGH (ref 0.3–1.2)
Total Protein: 6.3 g/dL — ABNORMAL LOW (ref 6.5–8.1)

## 2018-01-27 LAB — CBC
HCT: 49.4 % (ref 39.0–52.0)
HEMOGLOBIN: 17.8 g/dL — AB (ref 13.0–17.0)
MCH: 31.6 pg (ref 26.0–34.0)
MCHC: 36 g/dL (ref 30.0–36.0)
MCV: 87.7 fL (ref 78.0–100.0)
Platelets: 255 10*3/uL (ref 150–400)
RBC: 5.63 MIL/uL (ref 4.22–5.81)
RDW: 13.1 % (ref 11.5–15.5)
WBC: 15.6 10*3/uL — ABNORMAL HIGH (ref 4.0–10.5)

## 2018-01-27 LAB — LIPASE, BLOOD: Lipase: 27 U/L (ref 11–51)

## 2018-01-27 LAB — I-STAT TROPONIN, ED: Troponin i, poc: 0 ng/mL (ref 0.00–0.08)

## 2018-01-27 MED ORDER — IOPAMIDOL (ISOVUE-370) INJECTION 76%
INTRAVENOUS | Status: AC
  Administered 2018-01-27: 65 mL
  Filled 2018-01-27: qty 100

## 2018-01-27 MED ORDER — OXYCODONE-ACETAMINOPHEN 5-325 MG PO TABS
1.0000 | ORAL_TABLET | ORAL | Status: DC | PRN
  Administered 2018-01-27: 1 via ORAL
  Filled 2018-01-27: qty 1

## 2018-01-27 MED ORDER — PREDNISONE 20 MG PO TABS: 20.0000 mg | ORAL_TABLET | Freq: Two times a day (BID) | ORAL | 0 refills | Status: DC

## 2018-01-27 MED ORDER — FAMOTIDINE 20 MG IN NS 100 ML IVPB
20.0000 mg | Freq: Once | INTRAVENOUS | Status: AC
  Administered 2018-01-27: 20 mg via INTRAVENOUS
  Filled 2018-01-27: qty 100

## 2018-01-27 MED ORDER — SODIUM CHLORIDE 0.9 % IV BOLUS (SEPSIS)
1000.0000 mL | Freq: Once | INTRAVENOUS | Status: AC
  Administered 2018-01-27: 1000 mL via INTRAVENOUS

## 2018-01-27 MED ORDER — AZITHROMYCIN 250 MG PO TABS: ORAL_TABLET | ORAL | 0 refills | Status: DC

## 2018-01-27 NOTE — ED Notes (Signed)
Pt verbalized understanding discharge instructions and denies any further needs or questions at this time. VS stable, ambulatory and steady gait.   

## 2018-01-27 NOTE — ED Notes (Signed)
Patient transported to CT 

## 2018-01-27 NOTE — ED Provider Notes (Signed)
[facility] EMERGENCY DEPARTMENT Provider Note   CSN: [id number] Arrival date & time: 01/27/18  0030     History   Chief Complaint Chief Complaint  Patient presents with  . Chest Pain    HPI Arthur Mcdonald is a 27 y.o. male.  He presents for evaluation of sudden onset chest pain, 10 days ago, while he was working.  Pain is worse when he moves, deep breathes or palpates his right anterior chest wall.  He has not tried anything for the pain.  No specific trauma known.  No alteration in ability to work.  He works as a Therapist, occupational.  He denies fever, chills, nausea, vomiting, weakness or dizziness.  There are no other known modifying factors.  HPI  Past Medical History:  Diagnosis Date  . ADHD (attention deficit hyperactivity disorder)   . Anxiety   . Bipolar disorder (HCC)   . Skin infection     There are no active problems to display for this patient.   Past Surgical History:  Procedure Laterality Date  . Birthmark removed from right side of head         Home Medications    Prior to Admission medications   Medication Sig Start Date End Date Taking? Authorizing Provider  azithromycin (ZITHROMAX Z-PAK) 250 MG tablet 2 po day one, then 1 daily x 4 days 01/27/18   Mancel Bale, MD  cephALEXin (KEFLEX) 500 MG capsule Take 1 capsule (500 mg total) by mouth 4 (four) times daily. 09/02/14   Trixie Dredge, PA-C  cyclobenzaprine (FLEXERIL) 5 MG tablet Take 1 tablet (5 mg total) by mouth 3 (three) times daily as needed. 11/04/16   Deborha Payment, PA-C  naproxen (NAPROSYN) 500 MG tablet Take 1 tablet (500 mg total) by mouth 2 (two) times daily. 11/04/16   Deborha Payment, PA-C  predniSONE (DELTASONE) 20 MG tablet Take 1 tablet (20 mg total) by mouth 2 (two) times daily. 01/27/18   Mancel Bale, MD  salicylic acid 6 % gel Apply topically daily. Apply to wart 03/10/17   Lavera Guise, MD  traMADol (ULTRAM) 50 MG tablet Take 1 tablet (50 mg total) by mouth every  6 (six) hours as needed. 11/22/15   Arthor Captain, PA-C    Family History Family History  Problem Relation Age of Onset  . Diabetes Other   . Heart attack Other   . Diabetes Mother     Social History Social History   Tobacco Use  . Smoking status: Current Every Day Smoker    Packs/day: 1.00    Types: Cigarettes  . Smokeless tobacco: Never Used  Substance Use Topics  . Alcohol use: Yes  . Drug use: No     Allergies   Patient has no known allergies.   Review of Systems Review of Systems  All other systems reviewed and are negative.    Physical Exam Updated Vital Signs BP 125/71   Pulse 72   Temp 97.9 F (36.6 C) (Oral)   Resp 16   Ht 5\' 6"  (1.676 m)   Wt 95.3 kg (210 lb)   SpO2 98%   BMI 33.89 kg/m   Physical Exam  Constitutional: He is oriented to person, place, and time. He appears well-developed and well-nourished.  HENT:  Head: Normocephalic and atraumatic.  Right Ear: External ear normal.  Left Ear: External ear normal.  Eyes: Conjunctivae and EOM are normal. Pupils are equal, round, and reactive to light.  Neck: Normal  range of motion and phonation normal. Neck supple.  Cardiovascular: Normal rate, regular rhythm and normal heart sounds.  Chest wall is nontender to palpation  Pulmonary/Chest: Effort normal and breath sounds normal. No accessory muscle usage. No respiratory distress. He exhibits no bony tenderness.  Abdominal: Soft. There is no tenderness.  Musculoskeletal: Normal range of motion.  Neurological: He is alert and oriented to person, place, and time. No cranial nerve deficit or sensory deficit. He exhibits normal muscle tone. Coordination normal.  Skin: Skin is warm, dry and intact.  Psychiatric: He has a normal mood and affect. His behavior is normal. Judgment and thought content normal.  Nursing note and vitals reviewed.    ED Treatments / Results  Labs (all labs ordered are listed, but only abnormal results are  displayed) Labs Reviewed  CBC - Abnormal; Notable for the following components:      Result Value   WBC 15.6 (*)    Hemoglobin 17.8 (*)    All other components within normal limits  COMPREHENSIVE METABOLIC PANEL - Abnormal; Notable for the following components:   Total Protein 6.3 (*)    Total Bilirubin 1.3 (*)    All other components within normal limits  LIPASE, BLOOD  I-STAT TROPONIN, ED    EKG  EKG Interpretation None       Radiology Dg Chest 2 View  Result Date: 01/27/2018 CLINICAL DATA:  Chest pain. Upper respiratory infection symptoms for 1 week. EXAM: CHEST  2 VIEW COMPARISON:  04/21/2017 FINDINGS: The cardiomediastinal contours are normal. The lungs are clear. Pulmonary vasculature is normal. No consolidation, pleural effusion, or pneumothorax. No acute osseous abnormalities are seen. IMPRESSION: No acute pulmonary process. Electronically Signed   By: Rubye Oaks M.D.   On: 01/27/2018 01:18   Ct Angio Chest Pe W/cm &/or Wo Cm  Result Date: 01/27/2018 CLINICAL DATA:  Right side chest pain for 1.5 weeks, worsened last night. EXAM: CT ANGIOGRAPHY CHEST WITH CONTRAST TECHNIQUE: Multidetector CT imaging of the chest was performed using the standard protocol during bolus administration of intravenous contrast. Multiplanar CT image reconstructions and MIPs were obtained to evaluate the vascular anatomy. CONTRAST:  65 ml ISOVUE-370 IOPAMIDOL (ISOVUE-370) INJECTION 76% COMPARISON:  PA and lateral chest earlier today and 04/21/2017. FINDINGS: Cardiovascular: No pulmonary embolus is identified. Heart size is normal. No pericardial effusion. No aneurysm. No atherosclerosis. Mediastinum/Nodes: No enlarged mediastinal, hilar, or axillary lymph nodes. Thyroid gland, trachea, and esophagus demonstrate no significant findings. Lungs/Pleura: Extensive ground-glass attenuation is present throughout both lungs. Airways are unremarkable. No pleural effusion. Upper Abdomen: Negative.  Musculoskeletal: Negative. Review of the MIP images confirms the above findings. IMPRESSION: Negative for pulmonary embolus. Extensive ground-glass attenuation throughout both lungs is nonspecific and could be due to atelectasis if the patient was in expiration at the time of the scanning. Hypersensitivity pneumonitis and atypical infection can also cause this finding. Electronically Signed   By: Drusilla Kanner M.D.   On: 01/27/2018 11:27    Procedures Procedures (including critical care time)  Medications Ordered in ED Medications  famotidine (PEPCID) IVPB 20 mg in NS 100 mL IVPB (20 mg Intravenous Given 01/27/18 1039)  sodium chloride 0.9 % bolus 1,000 mL (0 mLs Intravenous Stopped 01/27/18 1124)  iopamidol (ISOVUE-370) 76 % injection (65 mLs  Contrast Given 01/27/18 1113)     Initial Impression / Assessment and Plan / ED Course  I have reviewed the triage vital signs and the nursing notes.  Pertinent labs & imaging results that were  available during my care of the patient were reviewed by me and considered in my medical decision making (see chart for details).     Patient Vitals for the past 24 hrs:  BP Temp Temp src Pulse Resp SpO2  01/27/18 1245 125/71 - - 72 16 98 %  01/27/18 0822 117/61 97.9 F (36.6 C) Oral (!) 20 20 97 %    At discharge- reevaluation with update and discussion. After initial assessment and treatment, an updated evaluation reveals he remains comfortable and has no further complaints. Mancel BaleElliott Glennis [place]en[place]      Final Clinical Impressions(s) / ED Diagnoses   Final diagnoses:  Nonspecific chest pain   Nonspecific chest pain, pain has been present for 2 weeks, with compromise in day-to-day activities.  Patient required evaluation with advanced imaging, CT angiogram.  CT angiogram was abnormal indicating groundglass appearance, with differential including hypersensitivity, and atypical pneumonia.  Screening blood work obtained, indicates elevated white count  consistent with infection, and elevated hemoglobin likely related to mild dehydration.  I-STAT troponin normal and lipase normal.  CMET is normal.  EKG does not indicate cardiac instability or abnormality.  Nursing Notes Reviewed/ Care Coordinated Applicable Imaging Reviewed Interpretation of Laboratory Data incorporated into ED treatment  The patient appears reasonably screened and/or stabilized for discharge and I doubt any other medical condition or other [facility] requiring further screening, evaluation, or treatment in the ED at this time prior to discharge.  Plan: Home Medications-OTC analgesia as needed; Home Treatments-rest, fluids; return here if the recommended treatment, does not improve the symptoms; Recommended follow up-PCP checkup 1 week and as needed.   ED Discharge Orders        Ordered    predniSONE (DELTASONE) 20 MG tablet  2 times daily     01/27/18 1250    azithromycin (ZITHROMAX Z-PAK) 250 MG tablet     01/27/18 1250       Mancel BaleWentz, Case Vassell, MD 01/28/18 [id number]

## 2018-01-27 NOTE — Discharge Instructions (Addendum)
Get plenty of rest, drink a lot of fluids and use Tylenol for pain.  Call the lung doctor for a follow-up appointment in 1 or 2 weeks.

## 2018-01-27 NOTE — ED Notes (Signed)
Pt's family requested that pt get pain medicine. Family stated that Pt's Pain is getting worse.

## 2018-02-17 ENCOUNTER — Emergency Department (HOSPITAL_COMMUNITY): Payer: Medicaid Other

## 2018-02-17 ENCOUNTER — Encounter (HOSPITAL_COMMUNITY): Payer: Self-pay | Admitting: Emergency Medicine

## 2018-02-17 ENCOUNTER — Emergency Department (HOSPITAL_COMMUNITY)
Admission: EM | Admit: 2018-02-17 | Discharge: 2018-02-17 | Disposition: A | Discharge status: Home / Self Care | Payer: Medicaid Other | Attending: Emergency Medicine | Admitting: Emergency Medicine

## 2018-02-17 ENCOUNTER — Other Ambulatory Visit: Payer: Self-pay

## 2018-02-17 DIAGNOSIS — M7989 Other specified soft tissue disorders: Secondary | ICD-10-CM

## 2018-02-17 DIAGNOSIS — M79641 Pain in right hand: Secondary | ICD-10-CM | POA: Insufficient documentation

## 2018-02-17 DIAGNOSIS — Z79899 Other long term (current) drug therapy: Secondary | ICD-10-CM | POA: Diagnosis not present

## 2018-02-17 DIAGNOSIS — R2231 Localized swelling, mass and lump, right upper limb: Secondary | ICD-10-CM | POA: Diagnosis present

## 2018-02-17 DIAGNOSIS — F17210 Nicotine dependence, cigarettes, uncomplicated: Secondary | ICD-10-CM | POA: Insufficient documentation

## 2018-02-17 MED ORDER — IBUPROFEN 400 MG PO TABS
600.0000 mg | ORAL_TABLET | Freq: Once | ORAL | Status: AC
  Administered 2018-02-17: 600 mg via ORAL
  Filled 2018-02-17: qty 1

## 2018-02-17 NOTE — Discharge Instructions (Signed)
Please use ibuprofen for pain, ice and elevation wrist brace for comfort.  If symptoms are not improving after several days of conservative therapy please follow-up with your primary doctor.  If you have significantly increased pain worsening swelling redness warmth or fevers, any other new or concerning symptoms return to the ED.

## 2018-02-17 NOTE — ED Provider Notes (Signed)
[facility]ENC[facility] Provider Note   CSN: [id number] Arrival date & time: 02/17/18  1517     History   Chief Complaint Chief Complaint  Patient presents with  . Hand Injury    HPI Arthur Mcdonald is a 27 y.o. male.  Arthur Mcdonald is a 27 y.o. Male with history of ADHD and bipolar disorder, presents to ED for evaluation of right hand pain and swelling. Pt reports Sunday he punched a wall several times after discovering that someone had killed his dog.  Patient reports since then he has had pain and swelling primarily over the MCP joints, decreased range of motion of the hand due to pain, but patient denies any numbness tingling or weakness.  He reports a few surface abrasions to the fingers which he cleaned out with soap and water, reports he is up-to-date on tetanus. No wrist pain. Denies any other injuries. No redness, warmth or fevers.  Patient has not tried any medications ice or elevation to treat his symptoms no other aggravating or alleviating factors.      Past Medical History:  Diagnosis Date  . ADHD (attention deficit hyperactivity disorder)   . Anxiety   . Bipolar disorder (HCC)   . Skin infection     There are no active problems to display for this patient.   Past Surgical History:  Procedure Laterality Date  . Birthmark removed from right side of head         Home Medications    Prior to Admission medications   Medication Sig Start Date End Date Taking? Authorizing Provider  azithromycin (ZITHROMAX Z-PAK) 250 MG tablet 2 po day one, then 1 daily x 4 days 01/27/18   Mancel BaleWentz, Elliott, MD  cephALEXin (KEFLEX) 500 MG capsule Take 1 capsule (500 mg total) by mouth 4 (four) times daily. 09/02/14   Trixie DredgeWest, Emily, PA-C  cyclobenzaprine (FLEXERIL) 5 MG tablet Take 1 tablet (5 mg total) by mouth 3 (three) times daily as needed. 11/04/16   Deborha PaymentMeyer, Ashley L, PA-C  naproxen (NAPROSYN) 500 MG tablet Take 1 tablet (500 mg total) by mouth 2 (two)  times daily. 11/04/16   Deborha PaymentMeyer, Ashley L, PA-C  predniSONE (DELTASONE) 20 MG tablet Take 1 tablet (20 mg total) by mouth 2 (two) times daily. 01/27/18   Mancel BaleWentz, Elliott, MD  salicylic acid 6 % gel Apply topically daily. Apply to wart 03/10/17   Lavera GuiseLiu, Dana Duo, MD  traMADol (ULTRAM) 50 MG tablet Take 1 tablet (50 mg total) by mouth every 6 (six) hours as needed. 11/22/15   Arthor CaptainHarris, Abigail, PA-C    Family History Family History  Problem Relation Age of Onset  . Diabetes Other   . Heart attack Other   . Diabetes Mother     Social History Social History   Tobacco Use  . Smoking status: Current Every Day Smoker    Packs/day: 1.00    Types: Cigarettes  . Smokeless tobacco: Never Used  Substance Use Topics  . Alcohol use: Yes  . Drug use: No     Allergies   Patient has no known allergies.   Review of Systems Review of Systems  Constitutional: Negative for chills and fever.  Musculoskeletal: Positive for arthralgias and joint swelling.  Skin: Positive for wound (Superficial abrasions).  Neurological: Negative for weakness and numbness.     Physical Exam Updated Vital Signs BP 132/64 (BP Location: Left Arm)   Pulse 95   Temp 98.6 F (37 C) (Oral)  Resp 14   SpO2 96%   Physical Exam  Constitutional: He appears well-developed and well-nourished. No distress.  HENT:  Head: Normocephalic and atraumatic.  Eyes: Right eye exhibits no discharge. Left eye exhibits no discharge.  Cardiovascular: Normal rate, regular rhythm and normal heart sounds.  Pulmonary/Chest: Effort normal. No respiratory distress.  Musculoskeletal:  Mild swelling and tenderness over MCPs, no snuffbox tenderness, no redness or warmth, few small surface abrasions over the PIP joints, appears to be well-healing, patient able to bend and extend fingers although range of motion is minimally limited by pain.  2+ radial and ulnar pulses and good capillary refill, sensation intact 5/5 grip strength.  Neurological:  He is alert. Coordination normal.  Skin: Skin is warm and dry. Capillary refill takes less than 2 seconds. He is not diaphoretic.  Psychiatric: He has a normal mood and affect. His behavior is normal.  Nursing note and vitals reviewed.    ED Treatments / Results  Labs (all labs ordered are listed, but only abnormal results are displayed) Labs Reviewed - No data to display  EKG  EKG Interpretation None       Radiology Dg Hand Complete Right  Result Date: 02/17/2018 CLINICAL DATA:  Pain in hand following punching wall, initial encounter EXAM: RIGHT HAND - COMPLETE 3+ VIEW COMPARISON:  01/13/2012 FINDINGS: Soft tissue swelling is noted over the metacarpals. No acute fracture or dislocation is noted. IMPRESSION: Soft tissue swelling without acute bony abnormality. Electronically Signed   By: Alcide Clever M.D.   On: 02/17/2018 16:25    Procedures Procedures (including critical care time)  Medications Ordered in ED Medications  ibuprofen (ADVIL,MOTRIN) tablet 600 mg (not administered)     Initial Impression / Assessment and Plan / ED Course  I have reviewed the triage vital signs and the nursing notes.  Pertinent labs & imaging results that were available during my care of the patient were reviewed by me and considered in my medical decision making (see chart for details).  Patient presents to the ED for evaluation of right hand pain and swelling after hitting a wall several times.  Right hand is neurovascularly intact, no redness warmth fevers or chills to suggest septic arthritis, few surface abrasions over the PIP joints which appear to be well-healing.  X-ray shows soft tissue swelling but no evidence of acute fracture.  Will place patient in wrist brace for comfort, ibuprofen for pain, encourage ice and elevation.  Patient to follow-up with his primary care doctor if not improving.  Return precautions discussed.  Patient expressed understanding and is in agreement with plan.   Work note provided.  Final Clinical Impressions(s) / ED Diagnoses   Final diagnoses:  Right hand pain  Swelling of right hand    ED Discharge Orders    None       Dartha Lodge, [place] 02/17/18 1714    Tegeler, Canary Brim, MD 02/18/18 0157

## 2018-02-17 NOTE — ED Triage Notes (Signed)
Pt reports beating right hand against wall and having severe pain and swelling. Able to move extremities.

## 2018-02-17 NOTE — ED Notes (Signed)
Patient transported to X-ray 

## 2018-09-07 ENCOUNTER — Emergency Department (HOSPITAL_COMMUNITY): Payer: Medicaid Other

## 2018-09-07 ENCOUNTER — Other Ambulatory Visit: Payer: Self-pay

## 2018-09-07 ENCOUNTER — Emergency Department (HOSPITAL_COMMUNITY)
Admission: EM | Admit: 2018-09-07 | Discharge: 2018-09-07 | Disposition: A | Discharge status: Home / Self Care | Payer: Medicaid Other | Attending: Emergency Medicine | Admitting: Emergency Medicine

## 2018-09-07 ENCOUNTER — Encounter (HOSPITAL_COMMUNITY): Payer: Self-pay | Admitting: Emergency Medicine

## 2018-09-07 DIAGNOSIS — M25512 Pain in left shoulder: Secondary | ICD-10-CM | POA: Insufficient documentation

## 2018-09-07 DIAGNOSIS — F17210 Nicotine dependence, cigarettes, uncomplicated: Secondary | ICD-10-CM | POA: Insufficient documentation

## 2018-09-07 MED ORDER — CYCLOBENZAPRINE HCL 5 MG PO TABS: 5.0000 mg | ORAL_TABLET | Freq: Three times a day (TID) | ORAL | 0 refills | Status: DC | PRN

## 2018-09-07 MED ORDER — NAPROXEN 500 MG PO TABS: 500.0000 mg | ORAL_TABLET | Freq: Two times a day (BID) | ORAL | 0 refills | Status: DC

## 2018-09-07 NOTE — ED Provider Notes (Signed)
[facility] EMERGENCY DEPARTMENT Provider Note   CSN: [id number] Arrival date & time: 09/07/18  1103     History   Chief Complaint Chief Complaint  Patient presents with  . Shoulder Pain    HPI Arthur Mcdonald is a 27 y.o. male.  The history is provided by the patient. No language interpreter was used.     27 year old male with hx of chronic back pain presenting c/o shoulder pain.  Pt report atraumatic L shoulder pain ongoing x 1 week.  Patient states pain started abruptly, described as a sharp stabbing sensation to his anterior shoulder, nonradiating, worse when he raises arms up or when he dropped his arm down quickly.  Pain is minimal at rest.  No associated fever, neck pain, chest pain, arm pain, focal numbness or weakness or rash.  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  He is right-hand dominant.  He recall injuring his left shoulder many years ago when he was playing football.  He has tried many over-the-counter treatment without relief.  Past Medical History:  Diagnosis Date  . ADHD (attention deficit hyperactivity disorder)   . Anxiety   . Bipolar disorder (HCC)   . Skin infection     There are no active problems to display for this patient.   Past Surgical History:  Procedure Laterality Date  . 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 Authorizing Provider  azithromycin (ZITHROMAX Z-PAK) 250 MG tablet 2 po day one, then 1 daily x 4 days 01/27/18   Mancel Bale, MD  cephALEXin (KEFLEX) 500 MG capsule Take 1 capsule (500 mg total) by mouth 4 (four) times daily. 09/02/14   Trixie Dredge, PA-C  cyclobenzaprine (FLEXERIL) 5 MG tablet Take 1 tablet (5 mg total) by mouth 3 (three) times daily as needed. 11/04/16   Deborha Payment, PA-C  naproxen (NAPROSYN) 500 MG tablet Take 1 tablet (500 mg total) by mouth 2 (two) times daily.  11/04/16   Deborha Payment, PA-C  predniSONE (DELTASONE) 20 MG tablet Take 1 tablet (20 mg total) by mouth 2 (two) times daily. 01/27/18   Mancel Bale, MD  salicylic acid 6 % gel Apply topically daily. Apply to wart 03/10/17   Lavera Guise, MD  traMADol (ULTRAM) 50 MG tablet Take 1 tablet (50 mg total) by mouth every 6 (six) hours as needed. 11/22/15   Arthor Captain, PA-C    Family History Family History  Problem Relation Age of Onset  . Diabetes Other   . Heart attack Other   . Diabetes Mother     Social History Social History   Tobacco Use  . Smoking status: Current Every Day Smoker    Packs/day: 1.00    Types: Cigarettes  . Smokeless tobacco: Never Used  Substance Use Topics  . Alcohol use: Yes  . Drug use: No     Allergies   Patient has no known allergies.   Review of Systems Review of Systems  Constitutional: Negative for fever.  Musculoskeletal: Positive for arthralgias.  Neurological: Negative for numbness.     Physical Exam Updated Vital Signs BP 137/76 (BP Location: Right Arm)   Pulse 72   Temp 98 F (36.7 C) (Oral)   Resp 18   SpO2 97%   Physical Exam  Constitutional: He is oriented to  person, place, and time. He appears well-developed and well-nourished. No distress.  HENT:  Head: Atraumatic.  Eyes: Conjunctivae are normal.  Neck: Neck supple.  No cervical midline spine tenderness.  Musculoskeletal: He exhibits tenderness (Left shoulder: Tenderness to [facility] joint on palpation without any overlying skin changes.  Shoulder with full range of motion however pain reproducible with arm raise up to 160 degrees.  No deformity.).  Neurological: He is alert and oriented to person, place, and time.  Skin: No rash noted.  Psychiatric: He has a normal mood and affect.  Nursing note and vitals reviewed.    ED Treatments / Results  Labs (all labs ordered are listed, but only abnormal results are displayed) Labs Reviewed - No data to  display  EKG None  Radiology Dg Shoulder Left  Result Date: 09/07/2018 CLINICAL DATA:  Pain in left shoulder for the past 5 days, no known recent injury. Pt states he had many football injuries to his shoulder in the past. He has also dislocated his left shoulder several times. EXAM: LEFT SHOULDER - 2+ VIEW COMPARISON:  12/21/2011 FINDINGS: There is no evidence of fracture or dislocation. There is no evidence of arthropathy or other focal bone abnormality. Soft tissues are unremarkable. IMPRESSION: Negative. Electronically Signed   By: Amie Portland M.D.   On: 09/07/2018 12:06    Procedures Procedures (including critical care time)  Medications Ordered in ED Medications - No data to display   Initial Impression / Assessment and Plan / ED Course  I have reviewed the triage vital signs and the nursing notes.  Pertinent labs & imaging results that were available during my care of the patient were reviewed by me and considered in my medical decision making (see chart for details).     BP 137/76 (BP Location: Right Arm)   Pulse 72   Temp 98 F (36.7 C) (Oral)   Resp 18   SpO2 97%    Final Clinical Impressions(s) / ED Diagnoses   Final diagnoses:  Acute pain of left shoulder    ED Discharge Orders         Ordered    cyclobenzaprine (FLEXERIL) 5 MG tablet  3 times daily PRN     09/07/18 1304    naproxen (NAPROSYN) 500 MG tablet  2 times daily     09/07/18 1304         11:53 AM Patient with left shoulder tenderness, atraumatic.  No signs of infection exam.  X-ray ordered.  1:03 PM X-ray of left shoulder without any acute abnormalities.  Will provide a sling for symptomatic support.  Orthopedic referral given as needed.  Rice therapy discussed.   Fayrene Helper, PA-C 09/07/18 1305    Melene Plan, DO 09/07/18 1348

## 2018-09-07 NOTE — ED Triage Notes (Signed)
Pt to ER for evaluation of left scapular and shoulder pain x1 week. Denies recent injury "that he knows of," reports shoulder injury in the past. Also reports lower back pain, reports hx of chronic back pain.

## 2018-09-07 NOTE — Discharge Instructions (Signed)
Please call and follow up with an orthopedist specialist for further evaluation of your left shoulder pain. Use sling as needed for support.

## 2018-09-15 ENCOUNTER — Emergency Department (HOSPITAL_COMMUNITY)
Admission: EM | Admit: 2018-09-15 | Discharge: 2018-09-15 | Disposition: A | Discharge status: Home / Self Care | Payer: Self-pay | Attending: Emergency Medicine | Admitting: Emergency Medicine

## 2018-09-15 ENCOUNTER — Encounter (HOSPITAL_COMMUNITY): Payer: Self-pay | Admitting: *Deleted

## 2018-09-15 ENCOUNTER — Other Ambulatory Visit: Payer: Self-pay

## 2018-09-15 ENCOUNTER — Emergency Department (HOSPITAL_COMMUNITY): Payer: Self-pay

## 2018-09-15 DIAGNOSIS — F17210 Nicotine dependence, cigarettes, uncomplicated: Secondary | ICD-10-CM | POA: Insufficient documentation

## 2018-09-15 DIAGNOSIS — M25511 Pain in right shoulder: Secondary | ICD-10-CM | POA: Insufficient documentation

## 2018-09-15 DIAGNOSIS — Z79899 Other long term (current) drug therapy: Secondary | ICD-10-CM | POA: Insufficient documentation

## 2018-09-15 NOTE — ED Triage Notes (Signed)
Pt returns today for RT shoulder pain. 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

## 2018-09-15 NOTE — ED Provider Notes (Signed)
[facility] EMERGENCY DEPARTMENT Provider Note   CSN: [id number] Arrival date & time: 09/15/18  1258     History   Chief Complaint Chief Complaint  Patient presents with  . Shoulder Pain    HPI Arthur Mcdonald is a 27 y.o. male presenting for right shoulder pain that began yesterday while working.  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  He states that the pain began gradually yesterday denies injury or trauma to the area or sudden identifiable cause for the pain.  Patient states that he has had similar pain in the past to his other shoulder which got better after a few days off of work.  Patient describes his right shoulder pain as a constant aching pain that is moderate in intensity worse with any movement of the right shoulder.  Patient states that he will occasionally feel his right shoulder pop with certain movements.  Patient states that he has used ibuprofen for his pain with moderate relief.  Patient is requesting a work note today.  HPI  Past Medical History:  Diagnosis Date  . ADHD (attention deficit hyperactivity disorder)   . Anxiety   . Bipolar disorder (HCC)   . Skin infection     There are no active problems to display for this patient.   Past Surgical History:  Procedure Laterality Date  . Birthmark removed from right side of head        Home Medications    Prior to Admission medications   Medication Sig Start Date End Date Taking? Authorizing Provider  azithromycin (ZITHROMAX Z-PAK) 250 MG tablet 2 po day one, then 1 daily x 4 days 01/27/18   Mancel Bale, MD  cephALEXin (KEFLEX) 500 MG capsule Take 1 capsule (500 mg total) by mouth 4 (four) times daily. 09/02/14   Trixie Dredge, PA-C  cyclobenzaprine (FLEXERIL) 5 MG tablet Take 1 tablet (5 mg total) by mouth 3 (three) times daily as needed. 09/07/18   Fayrene Helper, PA-C  naproxen (NAPROSYN) 500 MG tablet Take 1 tablet (500 mg  total) by mouth 2 (two) times daily. 09/07/18   Fayrene Helper, PA-C  predniSONE (DELTASONE) 20 MG tablet Take 1 tablet (20 mg total) by mouth 2 (two) times daily. 01/27/18   Mancel Bale, MD  salicylic acid 6 % gel Apply topically daily. Apply to wart 03/10/17   Lavera Guise, MD  traMADol (ULTRAM) 50 MG tablet Take 1 tablet (50 mg total) by mouth every 6 (six) hours as needed. 11/22/15   Arthor Captain, PA-C    Family History Family History  Problem Relation Age of Onset  . Diabetes Other   . Heart attack Other   . Diabetes Mother     Social History Social History   Tobacco Use  . Smoking status: Current Every Day Smoker    Packs/day: 1.00    Types: Cigarettes  . Smokeless tobacco: Never Used  Substance Use Topics  . Alcohol use: Yes  . Drug use: No     Allergies   Other   Review of Systems Review of Systems  Constitutional: Negative.  Negative for chills and fever.  Musculoskeletal: Positive for arthralgias. Negative for back pain, joint swelling and neck pain.  Skin: Negative.  Negative for color change and wound.  Neurological: Negative.  Negative for dizziness, weakness and numbness.   Physical Exam Updated Vital Signs BP 119/73 (BP Location: Left Arm)  Pulse (!) 59   Temp 97.8 F (36.6 C) (Oral)   Resp 16   Ht 5\' 7"  (1.702 m)   SpO2 98%   BMI 32.89 kg/m   Physical Exam  Constitutional: He is oriented to person, place, and time. He appears well-developed and well-nourished. No distress.  HENT:  Head: Normocephalic and atraumatic.  Right Ear: External ear normal.  Left Ear: External ear normal.  Nose: Nose normal.  Eyes: Pupils are equal, round, and reactive to light. EOM are normal.  Neck: Trachea normal and normal range of motion. No tracheal deviation present.  Pulmonary/Chest: Effort normal. No respiratory distress.  Abdominal: Soft. There is no tenderness. There is no rebound and no guarding.  Musculoskeletal: Normal range of motion.       Right  shoulder: He exhibits tenderness and deformity. He exhibits no bony tenderness, no swelling and no crepitus.       Right elbow: Normal.      Right wrist: Normal.       Cervical back: Normal.       Thoracic back: Normal.  Cervical Spine: Appearance normal. No obvious bony deformity. No skin swelling, erythema, heat, fluctuance or break of the skin. No TTP over the cervical spinous processes. No paraspinal tenderness. No step-offs. Patient is able to actively rotate their neck 45 degrees left and right voluntarily without pain and flex and extend the neck without pain. Negative Spurling's and Cervical Load test.   Right Shoulder: Appearance normal. No obvious bony deformity. No skin swelling, erythema, heat, fluctuance or break of the skin. No clavicular deformity or TTP. TTP diffusely over deltoid. Active and passive flexion, extension, abduction, adduction, and internal/external rotation intact with increase in pain with all movements but without crepitus. Strength for flexion, extension, abduction, adduction, and internal/external rotation intact and appropriate for age.  .  Right Elbow: Appearance normal. No obvious bony deformity. No skin swelling, erythema, heat, fluctuance or break of the skin. No TTP over joint. Active flexion, extension, supination and pronation full and intact without pain. Strength able and appropriate for age for flexion and extension.   Radial Pulse 2+. Cap refill <2 seconds. SILT for M/U/R distributions. Compartments soft.   Neurological: He is alert and oriented to person, place, and time. He has normal strength. No sensory deficit. GCS eye subscore is 4. GCS verbal subscore is 5. GCS motor subscore is 6.  Speech is clear and goal oriented, follows commands Major Cranial nerves without deficit, no facial droop Normal strength in upper and lower extremities bilaterally including dorsiflexion and plantar flexion, strong and equal grip strength Sensation normal to light and  sharp touch Moves extremities without ataxia, coordination intact Normal gait  Skin: Skin is warm and dry. Capillary refill takes less than 2 seconds.  Psychiatric: He has a normal mood and affect. His behavior is normal.   ED Treatments / Results  Labs (all labs ordered are listed, but only abnormal results are displayed) Labs Reviewed - No data to display  EKG None  Radiology Dg Shoulder Right  Result Date: 09/15/2018 CLINICAL DATA:  Right shoulder pain and stiffness since yesterday. EXAM: RIGHT SHOULDER - 2+ VIEW COMPARISON:  CXR 01/27/2018 FINDINGS: There is no evidence of fracture or dislocation. There is no evidence of arthropathy or other focal bone abnormality. Soft tissues are unremarkable. IMPRESSION: Negative. Electronically Signed   By: Tollie Eth M.D.   On: 09/15/2018 14:58    Procedures Procedures (including critical care time)  Medications Ordered in  ED Medications - No data to display   Initial Impression / Assessment and Plan / ED Course  I have reviewed the triage vital signs and the nursing notes.  Pertinent labs & imaging results that were available during my care of the patient were reviewed by me and considered in my medical decision making (see chart for details).    Patient presenting for right-sided shoulder pain.  Physical exam shows no instability deformity of acromioclavicular and sternoclavicular joints, the cervical spine, glenohumeral joint, coracoid process, acromion, or scapula.  Imaging of the right shoulder negative for acute finding. Patient with good strength and range of motion to all movements of the right shoulder however with increased pain with any movement. Patient neurovascular intact to bilateral upper extremities. Patient has been given sling for comfort here in the emergency department and informed that he may use over-the-counter anti-inflammatories as directed on the packaging for pain.  Patient encouraged to use rest and ice to  help with pain. Patient has been given orthopedic referral for further evaluation of his shoulder pain. Patient has been given work note for rest.  At this time there does not appear to be any evidence of an acute emergency medical condition and the patient appears stable for discharge with appropriate outpatient follow up. Diagnosis was discussed with patient who verbalizes understanding of care plan and is agreeable to discharge. I have discussed return precautions with patient who verbalizes understanding of return precautions. Patient strongly encouraged to follow-up with their PCP. All questions answered.   Note: Portions of this report may have been transcribed using voice recognition software. Every effort was made to ensure accuracy; however, inadvertent computerized transcription errors may still be present.  Final Clinical Impressions(s) / ED Diagnoses   Final diagnoses:  Acute pain of right shoulder    ED Discharge Orders    None       Elizabeth Palau 09/15/18 1600    Eber Hong, MD 09/16/18 [id number]

## 2018-09-15 NOTE — ED Notes (Signed)
Declined W/C at D/C and was escorted to lobby by RN. 

## 2018-09-15 NOTE — Discharge Instructions (Signed)
Please return to the Emergency Department for any new or worsening symptoms or if your symptoms do not improve. Please be sure to follow up with your Primary Care Physician as soon as possible regarding your visit today. If you do not have a Primary Doctor please use the resources below to establish one. You may use over-the-counter anti-inflammatory medications as directed on the packaging for pain. You may use rest, ice and elevation to help with your shoulder pain.  Please use the sling as needed to help rest her shoulder. Please follow-up with the orthopedic doctor on your after visit summary for further evaluation of your shoulder pain.  Contact a health care provider if: Your pain gets worse. Your pain is not relieved with medicines. New pain develops in your arm, hand, or fingers. Get help right away if: Your arm, hand, or fingers: Tingle. Become numb. Become swollen. Become painful. Turn white or blue.  Do not take your medicine if  develop an itchy rash, swelling in your mouth or lips, or difficulty breathing.   RESOURCE GUIDE  Chronic Pain Problems: Contact [facility]  [phone] Patients need to be referred by their primary care doctor.  Insufficient Money for Medicine: Contact United Way:  call "211" or [facility] [phone].  No Primary Care Doctor: Call [facility]  [phone] - can help you locate a primary care doctor that  accepts your insurance, provides certain services, etc. Physician Referral Service- [phone]  Agencies that provide inexpensive medical care: [facility]  [phone] [facility]  [phone] [facility]  [phone] [facility]  [phone] [facility]  [phone] [facility]  [phone]  Medicaid-accepting [facility] Providers: [facility]- [address]  [phone], Mon-Fri 9am-7pm, Sat 9am-1pm [facility]- [address] [place], Suite [place]  [phone] [facility]- [address], Suite [place]  [phone] [facility]- [address]  [phone] Renaye Rakers- [address] [place], [address], [phone]  Only accepts [place] patients after they have their name  applied to their card  Self Pay (no insurance) in [facility]: Sickle Cell Patients: Dr Willey Blade, [facility] Internal Medicine  [address] [place], [phone] [facility]- [address]  [phone]       [facility] [place]- [address]       -     [facility]- see information above (Speak to [employer] if you do not have insurance)       -  [facility]- [address] [place], [phone]       -  [facility]- [address],  [phone]       -  [facility]- [address] [phone]       -  Dr Julio Sicks-  [address], [place], [phone]       -  [facility]- [address] [phone]       -  [facility]- [address], [phone], also [address], [phone]       -    [facility]- [address] [place], [phone], 1st & 3rd Saturday   every month, 10am-1pm  1) Find a Librarian, academic and Pay Out of  Pocket Although you won't have to find out who is covered by your insurance plan, it is a good idea to ask around and get recommendations. You will then need to call the office and see if the doctor you have chosen will accept you as a new patient and what types of options they offer for patients who are self-pay. Some doctors offer discounts or will set up payment plans for their patients who do not have insurance, but you will need to ask so you aren't surprised when you get to your appointment.  2) Contact Your Local Health Department Not all health departments have doctors that can see patients for sick visits, but many do, so it  is worth a call to see if yours does. If you don't know where your local health department is, you can check in your phone book. The CDC also has a tool to help you locate your state's health department, and many state websites also have listings of all of their local health departments.  3) Find a Walk-in Clinic If your illness is not likely to be very severe or complicated, you may want to try a walk in clinic. These are popping up all over the country in pharmacies, drugstores, and shopping centers. They're usually staffed by nurse practitioners or physician assistants that have been trained to treat common illnesses and complaints. They're usually fairly quick and inexpensive. However, if you have serious medical issues or chronic medical problems, these are probably not your best option  STD Testing [facility] Department of [facility] [place], STD Clinic, [address] [place], phone [phone] or [phone].  Monday - Friday, call for an appointment. [facility] Department of [employer], STD Clinic, [place] [address], [place], phone [phone] or [phone].  Monday - Friday, call for an appointment.  Abuse/Neglect: [facility] Child Abuse Hotline [phone] [facility] Child Abuse Hotline 9157363200 (After Hours)  Emergency Shelter:  [facility] 9258240238  Maternity Homes: [facility] at the [place] [facility] [phone] [facility] [phone]  MRSA Hotline #:   (713)524-5379  [facility] Resources  [facility] [place]  [employer] [facility] Dept. [address]                 [address]         [address]  Blondell Reveal Phone:  [phone]                                  Phone:  [phone]                   Phone:  [phone]  [facility],  [phone] [phone]       -     [facility] in [place], [address]  [phone], [place] [phone] or [phone] (After Hours)   [place]  Substance Abuse Resources: Alcohol and Drug Services  [place] 205-104-6995 [facility] Ch[facility]a Pester 475-752-8524 Residential & Outpatient Substance Abuse Program  [phone]  Psychological Services: [place]  [phone] [place]  [place], [place] [address] [place], [place]: [phone] or [phone], [email or web address]  Dental Assistance  If unable to pay or uninsured, contact:  [facility] or [facility]. to become qualified for the adult dental clinic.  Patients with Medicaid: [facility] [phone] [address], [place] [address], [phone]  If unable to pay, or uninsured, contact [facility] [phone]) or [employer] [phone] in [place], [place] in [facility]) to become qualified for the adult dental clinic   Other [place]- [place], [place], [place], [address], [place], [place], 2nd and 4th Thursday of the month at 6:30am.  10 clients each day by appointment, can sometimes see walk-in patients if someone does not show for an appointment. [facility]- [address] Hillard Danker [place], [place], [address], [place], [place], [place], [address], [place] Department- [phone] [facility] [phone]

## 2018-09-19 ENCOUNTER — Other Ambulatory Visit: Payer: Self-pay

## 2018-09-19 ENCOUNTER — Emergency Department (HOSPITAL_COMMUNITY)
Admission: EM | Admit: 2018-09-19 | Discharge: 2018-09-19 | Disposition: A | Discharge status: Home / Self Care | Payer: Self-pay | Attending: Emergency Medicine | Admitting: Emergency Medicine

## 2018-09-19 ENCOUNTER — Encounter (HOSPITAL_COMMUNITY): Payer: Self-pay | Admitting: Emergency Medicine

## 2018-09-19 DIAGNOSIS — F17210 Nicotine dependence, cigarettes, uncomplicated: Secondary | ICD-10-CM | POA: Insufficient documentation

## 2018-09-19 DIAGNOSIS — N509 Disorder of male genital organs, unspecified: Secondary | ICD-10-CM | POA: Insufficient documentation

## 2018-09-19 DIAGNOSIS — F909 Attention-deficit hyperactivity disorder, unspecified type: Secondary | ICD-10-CM | POA: Insufficient documentation

## 2018-09-19 DIAGNOSIS — Z79899 Other long term (current) drug therapy: Secondary | ICD-10-CM | POA: Insufficient documentation

## 2018-09-19 MED ORDER — SULFAMETHOXAZOLE-TRIMETHOPRIM 800-160 MG PO TABS: 1.0000 | ORAL_TABLET | Freq: Two times a day (BID) | ORAL | 0 refills | Status: AC

## 2018-09-19 MED ORDER — KETOROLAC TROMETHAMINE 15 MG/ML IJ SOLN
15.0000 mg | Freq: Once | INTRAMUSCULAR | Status: AC
  Administered 2018-09-19: 15 mg via INTRAMUSCULAR
  Filled 2018-09-19: qty 1

## 2018-09-19 MED ORDER — CEPHALEXIN 500 MG PO CAPS: 500.0000 mg | ORAL_CAPSULE | Freq: Three times a day (TID) | ORAL | 0 refills | Status: AC

## 2018-09-19 NOTE — ED Triage Notes (Signed)
Pt. Stated, I have 2 boils on my testicle for 4 days.

## 2018-09-19 NOTE — ED Provider Notes (Signed)
[facility] EMERGENCY DEPARTMENT Provider Note   CSN: [id number] Arrival date & time: 09/19/18  1138     History   Chief Complaint Chief Complaint  Patient presents with  . Abscess    HPI Arthur Mcdonald is a 27 y.o. male today for evaluation of lesions on his right testicle.  Patient states over the past 4 days, he has been having to gradually developing lesions on his right testicle.  They are tender.  They are not draining.  He reports history of similar lesions on his shoulder which needed drainage.  He denies lesions on his left testicle or penile pain.  He denies penile discharge.  He has no medical problems, takes no medications daily.  He does not know when his last tetanus shot was.  He has been using benzocaine cream and Tylenol/ibuprofen for pain without improvement of symptoms.  He states he took 1 of his wife's leftover Keflex pills without improvement of symptoms.  He has not taken anything else.  Pain is constant, worse with palpation.  Nothing makes it better.  Denies fevers, chills, nausea, vomiting, abdominal pain, or urinary symptoms.  HPI  Past Medical History:  Diagnosis Date  . ADHD (attention deficit hyperactivity disorder)   . Anxiety   . Bipolar disorder (HCC)   . Skin infection     There are no active problems to display for this patient.   Past Surgical History:  Procedure Laterality Date  . 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 Authorizing Provider  azithromycin (ZITHROMAX Z-PAK) 250 MG tablet 2 po day one, then 1 daily x 4 days 01/27/18   Mancel Bale, MD  cephALEXin (KEFLEX) 500 MG capsule Take 1 capsule (500 mg total) by mouth 3 (three) times daily for 7 days. 09/19/18 09/26/18  Beatris Belen, PA-C  cyclobenzaprine (FLEXERIL) 5 MG tablet Take 1 tablet (5 mg total) by mouth 3 (three) times daily as needed.  09/07/18   Fayrene Helper, PA-C  naproxen (NAPROSYN) 500 MG tablet Take 1 tablet (500 mg total) by mouth 2 (two) times daily. 09/07/18   Fayrene Helper, PA-C  predniSONE (DELTASONE) 20 MG tablet Take 1 tablet (20 mg total) by mouth 2 (two) times daily. 01/27/18   Mancel Bale, MD  salicylic acid 6 % gel Apply topically daily. Apply to wart 03/10/17   Lavera Guise, MD  sulfamethoxazole-trimethoprim (BACTRIM DS,SEPTRA DS) 800-160 MG tablet Take 1 tablet by mouth 2 (two) times daily for 7 days. 09/19/18 09/26/18  Shatonia Hoots, PA-C  traMADol (ULTRAM) 50 MG tablet Take 1 tablet (50 mg total) by mouth every 6 (six) hours as needed. 11/22/15   Arthor Captain, PA-C    Family History Family History  Problem Relation Age of Onset  . Diabetes Other   . Heart attack Other   . Diabetes Mother     Social History Social History   Tobacco Use  . Smoking status: Current Every Day Smoker    Packs/day: 1.00    Types: Cigarettes  . Smokeless tobacco: Never Used  Substance Use Topics  . Alcohol use: Yes  . Drug use: No     Allergies   Other   Review of Systems Review of Systems  Constitutional: Negative for fever.  Skin:       Lesions on right testicle  Physical Exam Updated Vital Signs BP 138/75 (BP Location: Right Arm)   Pulse 96   Temp 98 F (36.7 C) (Oral)   Resp 17   Ht 5\' 7"  (1.702 m)   Wt 96.6 kg   SpO2 97%   BMI 33.36 kg/m   Physical Exam  Constitutional: He is oriented to person, place, and time. He appears well-developed and well-nourished. No distress.  HENT:  Head: Normocephalic and atraumatic.  Eyes: EOM are normal.  Neck: Normal range of motion.  Cardiovascular: Normal rate, regular rhythm and intact distal pulses.  Pulmonary/Chest: Effort normal and breath sounds normal. No respiratory distress. He has no wheezes.  Abdominal: Soft. He exhibits no distension. There is no tenderness. There is no guarding.  Genitourinary: Penis normal. Right testis shows  tenderness. Left testis shows no mass, no swelling and no tenderness. Uncircumcised.  Genitourinary Comments: Chaperone present.  2 small official lesions on the right testicle without active drainage.  No surrounding skin erythema or streaking.  No inguinal lymphadenopathy.  No lesions noted elsewhere.  No tenderness along the epididymis.  Musculoskeletal: Normal range of motion.  Neurological: He is alert and oriented to person, place, and time.  Skin: Skin is warm. Capillary refill takes less than 2 seconds. No rash noted.  Psychiatric: He has a normal mood and affect.  Nursing note and vitals reviewed.    ED Treatments / Results  Labs (all labs ordered are listed, but only abnormal results are displayed) Labs Reviewed - No data to display  EKG None  Radiology No results found.   EMERGENCY DEPARTMENT US SOFT TISSUE INTERPRETATION "Study: Limited Soft Tissue Ultrasound"  INDICATIONS: Pain Multiple views of the body part were obtained in real-time with a multi-frequency linear probe  PERFORMED BY: Myself IMAGES ARCHIVED?: No SIDE:Right  BODY PART:testicle INTERPRETATION:  No abcess noted     Procedures Procedures (including critical care time)  Medications Ordered in ED Medications  ketorolac (TORADOL) 15 MG/ML injection 15 mg (has no administration in time range)     Initial Impression / Assessment and Plan / ED Course  I have reviewed the triage vital signs and the nursing notes.  Pertinent labs & imaging results that were available during my care of the patient were reviewed by me and considered in my medical decision making (see chart for details).     Pt presenting for evaluation of 2 lesions on his right testicle.  Physical exam reassuring, he is afebrile not tachycardic.  Appears nontoxic.  Doubt systemic infection.  On testicular exam, patient with 2 small superficial lesions.  Bedside ultrasound without obvious pocket.  Discussed risks and benefits of  attempted I&D today.  Patient elects to try warm compresses and antibiotics with close monitoring.  Patient to follow-up if symptoms worsen.  At this time, patient appears safe for discharge.  Return precautions given.  Patient states he understands and agrees plan.  Final Clinical Impressions(s) / ED Diagnoses   Final diagnoses:  Testicular lesion    ED Discharge Orders         Ordered    cephALEXin (KEFLEX) 500 MG capsule  3 times daily     09/19/18 1327    sulfamethoxazole-trimethoprim (BACTRIM DS,SEPTRA DS) 800-160 MG tablet  2 times daily     09/19/18 1327           [place], PA-C 09/19/18 1328    Mancel Bale, MD 09/19/18 1609

## 2018-09-19 NOTE — Discharge Instructions (Addendum)
Take antibiotics as prescribed.  Take the entire course, even if your symptoms improve. Use warm compress 4 times a day. Use Tylenol and ibuprofen as needed for pain.  Continue using benzocaine cream for pain. Return to the emergency room if you develop fevers, pain is worsening, lesions or spreading, or with any new or concerning symptoms.

## 2018-09-29 ENCOUNTER — Other Ambulatory Visit: Payer: Self-pay

## 2018-09-29 ENCOUNTER — Emergency Department (HOSPITAL_COMMUNITY): Payer: Self-pay

## 2018-09-29 ENCOUNTER — Emergency Department (HOSPITAL_COMMUNITY)
Admission: EM | Admit: 2018-09-29 | Discharge: 2018-09-29 | Disposition: A | Discharge status: Home / Self Care | Payer: Self-pay | Attending: Emergency Medicine | Admitting: Emergency Medicine

## 2018-09-29 ENCOUNTER — Encounter (HOSPITAL_COMMUNITY): Payer: Self-pay

## 2018-09-29 DIAGNOSIS — F17210 Nicotine dependence, cigarettes, uncomplicated: Secondary | ICD-10-CM | POA: Insufficient documentation

## 2018-09-29 DIAGNOSIS — S8392XA Sprain of unspecified site of left knee, initial encounter: Secondary | ICD-10-CM | POA: Insufficient documentation

## 2018-09-29 DIAGNOSIS — Y999 Unspecified external cause status: Secondary | ICD-10-CM | POA: Insufficient documentation

## 2018-09-29 DIAGNOSIS — Y9389 Activity, other specified: Secondary | ICD-10-CM | POA: Insufficient documentation

## 2018-09-29 DIAGNOSIS — Y92410 Unspecified street and highway as the place of occurrence of the external cause: Secondary | ICD-10-CM | POA: Insufficient documentation

## 2018-09-29 MED ORDER — METHOCARBAMOL 500 MG PO TABS: 500.0000 mg | ORAL_TABLET | Freq: Two times a day (BID) | ORAL | 0 refills | Status: DC

## 2018-09-29 NOTE — ED Notes (Signed)
Patient transported to X-ray 
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
## 2018-11-27 ENCOUNTER — Emergency Department (HOSPITAL_COMMUNITY)
Admission: EM | Admit: 2018-11-27 | Discharge: 2018-11-27 | Disposition: A | Discharge status: Home / Self Care | Payer: Self-pay | Attending: Emergency Medicine | Admitting: Emergency Medicine

## 2018-11-27 ENCOUNTER — Other Ambulatory Visit: Payer: Self-pay

## 2018-11-27 DIAGNOSIS — L03115 Cellulitis of right lower limb: Secondary | ICD-10-CM | POA: Insufficient documentation

## 2018-11-27 DIAGNOSIS — F17210 Nicotine dependence, cigarettes, uncomplicated: Secondary | ICD-10-CM | POA: Insufficient documentation

## 2018-11-27 DIAGNOSIS — Z79899 Other long term (current) drug therapy: Secondary | ICD-10-CM | POA: Insufficient documentation

## 2018-11-27 LAB — BASIC METABOLIC PANEL
Anion gap: 11 (ref 5–15)
BUN: 13 mg/dL (ref 6–20)
CALCIUM: 9.1 mg/dL (ref 8.9–10.3)
CO2: 24 mmol/L (ref 22–32)
CREATININE: 1.01 mg/dL (ref 0.61–1.24)
Chloride: 106 mmol/L (ref 98–111)
GFR calc Af Amer: >60 mL/min (ref >60)
Glucose, Bld: 108 mg/dL — ABNORMAL HIGH (ref 70–99)
Potassium: 3.5 mmol/L (ref 3.5–5.1)
SODIUM: 141 mmol/L (ref 135–145)

## 2018-11-27 LAB — CBC WITH DIFFERENTIAL/PLATELET
ABS IMMATURE GRANULOCYTES: 0.05 10*3/uL (ref 0.00–0.07)
BASOS PCT: 1 %
Basophils Absolute: 0.1 10*3/uL (ref 0.0–0.1)
EOS ABS: 0.4 10*3/uL (ref 0.0–0.5)
EOS PCT: 2 %
HCT: 45.8 % (ref 39.0–52.0)
Hemoglobin: 15.7 g/dL (ref 13.0–17.0)
Immature Granulocytes: 0 %
Lymphocytes Relative: 26 %
Lymphs Abs: 3.7 10*3/uL (ref 0.7–4.0)
MCH: 30.3 pg (ref 26.0–34.0)
MCHC: 34.3 g/dL (ref 30.0–36.0)
MCV: 88.4 fL (ref 80.0–100.0)
MONO ABS: 1 10*3/uL (ref 0.1–1.0)
Monocytes Relative: 7 %
NEUTROS ABS: 9.4 10*3/uL — AB (ref 1.7–7.7)
Neutrophils Relative %: 64 %
PLATELETS: 249 10*3/uL (ref 150–400)
RBC: 5.18 MIL/uL (ref 4.22–5.81)
RDW: 12.8 % (ref 11.5–15.5)
WBC: 14.6 10*3/uL — AB (ref 4.0–10.5)
nRBC: 0 % (ref 0.0–0.2)

## 2018-11-27 MED ORDER — VANCOMYCIN HCL IN DEXTROSE 1-5 GM/200ML-% IV SOLN
1000.0000 mg | Freq: Once | INTRAVENOUS | Status: AC
  Administered 2018-11-27: 1000 mg via INTRAVENOUS
  Filled 2018-11-27: qty 200

## 2018-11-27 MED ORDER — KETOROLAC TROMETHAMINE 30 MG/ML IJ SOLN: 30.0000 mg | Freq: Once | INTRAMUSCULAR | Status: DC

## 2018-11-27 MED ORDER — DOXYCYCLINE HYCLATE 100 MG PO CAPS: 100.0000 mg | ORAL_CAPSULE | Freq: Two times a day (BID) | ORAL | 0 refills | Status: AC

## 2018-11-27 NOTE — ED Triage Notes (Signed)
Per pt he stated he was bitten by spider on his right heel. Pt has no fever or chills, are is swollen around the area.

## 2018-11-27 NOTE — ED Provider Notes (Signed)
[facility]   CSN: [id number] Arrival date & time: 11/27/18  2033     History   Chief Complaint Chief Complaint  Patient presents with  . Insect Bite    HPI Aison Monico BlitzR Sage is a 27 y.o. male presenting to the emergency department with a couple of days of painful wound to his right heel.  He is unsure if it is a spider bite, however appears similar. He states swelling and pain have been worsening.  He has been treating with over-the-counter Tylenol without significant relief.  States today he noticed a little bit of green at the center of the wound, he tried to squeeze it however no purulent drainage expressed.  He denies fever, chills, or systemic symptoms.  No history of MRSA. No hx of immunocompromise.   The history is provided by the patient.    Past Medical History:  Diagnosis Date  . ADHD (attention deficit hyperactivity disorder)   . Anxiety   . Bipolar disorder (HCC)   . Skin infection     There are no active problems to display for this patient.   Past Surgical History:  Procedure Laterality Date  . 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 Authorizing Provider  azithromycin (ZITHROMAX Z-PAK) 250 MG tablet 2 po day one, then 1 daily x 4 days Patient not taking: Reported on 09/29/2018 01/27/18   Mancel BaleWentz, Elliott, MD  cyclobenzaprine (FLEXERIL) 5 MG tablet Take 1 tablet (5 mg total) by mouth 3 (three) times daily as needed. Patient not taking: Reported on 09/29/2018 09/07/18   Fayrene Helperran, Bowie, PA-C  doxycycline (VIBRAMYCIN) 100 MG capsule Take 1 capsule (100 mg total) by mouth 2 (two) times daily for 7 days. 11/27/18 12/04/18  Robinson, [place] N, PA-C  methocarbamol (ROBAXIN) 500 MG tablet Take 1 tablet (500 mg total) by mouth 2 (two) times daily. 09/29/18   Maxwell CaulLayden, Lindsey A, PA-C  naproxen (NAPROSYN) 500 MG  tablet Take 1 tablet (500 mg total) by mouth 2 (two) times daily. Patient not taking: Reported on 09/29/2018 09/07/18   Fayrene Helperran, Bowie, PA-C  predniSONE (DELTASONE) 20 MG tablet Take 1 tablet (20 mg total) by mouth 2 (two) times daily. Patient not taking: Reported on 09/29/2018 01/27/18   Mancel BaleWentz, Elliott, MD  salicylic acid 6 % gel Apply topically daily. Apply to wart Patient not taking: Reported on 09/29/2018 03/10/17   Lavera GuiseLiu, Dana Duo, MD  traMADol (ULTRAM) 50 MG tablet Take 1 tablet (50 mg total) by mouth every 6 (six) hours as needed. Patient not taking: Reported on 09/29/2018 11/22/15   Arthor CaptainHarris, Abigail, PA-C    Family History Family History  Problem Relation Age of Onset  . Diabetes Other   . Heart attack Other   . Diabetes Mother     Social History Social History   Tobacco Use  . Smoking status: Current Every Day Smoker    Packs/day: 1.00    Types: Cigarettes  . Smokeless tobacco: Never Used  Substance Use Topics  . Alcohol use: Yes  . Drug use: No     Allergies   Other   Review of Systems Review of Systems  Constitutional: Negative for chills and fever.  Skin: Positive for color change and wound.  Allergic/Immunologic: Negative for immunocompromised state.  All other systems reviewed and are negative.  Physical Exam Updated Vital Signs BP 123/84 (BP Location: Right Arm)   Pulse 99   Temp 98.4 F (36.9 C) (Oral)   Resp 19   Ht 5\' 7"  (1.702 m)   Wt 93 kg   SpO2 98%   BMI 32.11 kg/m   Physical Exam Vitals signs and nursing note reviewed.  Constitutional:      Appearance: He is well-developed. He is not ill-appearing or diaphoretic.  HENT:     Head: Normocephalic and atraumatic.  Eyes:     Conjunctiva/sclera: Conjunctivae normal.  Cardiovascular:     Rate and Rhythm: Normal rate and regular rhythm.     Pulses: Normal pulses.  Pulmonary:     Effort: Pulmonary effort is normal.  Abdominal:     Palpations: Abdomen is soft.  Musculoskeletal:      Comments: Distal aspect of right posterior lower leg with about 3 cm area of redness and induration with central punctate open wound draining serous fluid.  Associated tenderness.  There is no fluctuance.  No purulent drainage expressed.  There is surrounding swelling with some faint streaking noted up the medial calf.   Skin:    General: Skin is warm.  Neurological:     Mental Status: He is alert.  Psychiatric:        Behavior: Behavior normal.          ED Treatments / Results  Labs (all labs ordered are listed, but only abnormal results are displayed) Labs Reviewed  CBC WITH DIFFERENTIAL/PLATELET - Abnormal; Notable for the following components:      Result Value   WBC 14.6 (*)    Neutro Abs 9.4 (*)    All other components within normal limits  BASIC METABOLIC PANEL - Abnormal; Notable for the following components:   Glucose, Bld 108 (*)    All other components within normal limits  CULTURE, BLOOD (ROUTINE X 2)  CULTURE, BLOOD (ROUTINE X 2)    EKG None  Radiology No results found.  Procedures Procedures (including critical care time)  Medications Ordered in ED Medications  vancomycin (VANCOCIN) IVPB 1000 mg/200 mL premix (1,000 mg Intravenous New Bag/Given 11/27/18 2225)  ketorolac (TORADOL) 30 MG/ML injection 30 mg (has no administration in time range)     Initial Impression / Assessment and Plan / ED Course  I have reviewed the triage vital signs and the nursing notes.  Pertinent labs & imaging results that were available during my care of the patient were reviewed by me and considered in my medical decision making (see chart for details).     Patient with area of induration and cellulitis to the right distal posterior lower leg.  Some concerns for lymphangitis up the medial calf.  Patient is overall well-appearing, afebrile, normal vital signs.  No systemic symptoms.  No known history of MRSA.  No history of immunocompromise.Patient was discussed with Dr.  Charm Barges.  Plan for labs and a dose of IV antibiotics.  If labs are without significant abnormalities, he is safe for discharge with p.o. antibiotics and outpatient follow-up.   11:05 PM CBC with mild leukocytosis of 14.6. BMP unremarkable.  Dr. Charm Barges agreeable to plan for p.o. antibiotics and outpatient follow-up.  Discussed outpatient management and close follow up. Pt agrees with plan and discharge.   Discussed results, findings, treatment and follow up. Patient advised of return precautions. Patient verbalized understanding and agreed with plan.   Final Clinical Impressions(s) / ED Diagnoses   Final diagnoses:  Cellulitis of right  lower extremity    ED Discharge Orders         Ordered    doxycycline (VIBRAMYCIN) 100 MG capsule  2 times daily     11/27/18 2305           Robinson, [place] N, [place] JerseyPA-C 11/27/18 2305    Terrilee FilesButler, Michael C, MD 11/28/18 1116

## 2018-11-27 NOTE — Discharge Instructions (Addendum)
Please read instructions below. Take your antibiotic, doxycycline, until it is gone. You can treat your symptoms with ibuprofen and Tylenol. Follow-up with primary care, urgent care, or return to the ED within 3 days for recheck. Return to the ED sooner if you develop fever, chills, he began feeling ill, or for new or worsening symptoms.

## 2018-11-27 NOTE — ED Notes (Signed)
Patient verbalizes understanding of discharge instructions. Opportunity for questioning and answers were provided. Armband removed by staff, pt discharged from ED ambulatory.   

## 2018-12-03 LAB — CULTURE, BLOOD (ROUTINE X 2)
CULTURE: NO GROWTH
Special Requests: ADEQUATE

## 2018-12-20 ENCOUNTER — Emergency Department (HOSPITAL_COMMUNITY)
Admission: EM | Admit: 2018-12-20 | Discharge: 2018-12-20 | Disposition: A | Discharge status: Home / Self Care | Payer: Self-pay | Attending: Emergency Medicine | Admitting: Emergency Medicine

## 2018-12-20 ENCOUNTER — Other Ambulatory Visit: Payer: Self-pay

## 2018-12-20 ENCOUNTER — Encounter (HOSPITAL_COMMUNITY): Payer: Self-pay | Admitting: Emergency Medicine

## 2018-12-20 DIAGNOSIS — F17210 Nicotine dependence, cigarettes, uncomplicated: Secondary | ICD-10-CM | POA: Insufficient documentation

## 2018-12-20 DIAGNOSIS — J029 Acute pharyngitis, unspecified: Secondary | ICD-10-CM | POA: Insufficient documentation

## 2018-12-20 LAB — GROUP A STREP BY PCR: Group A Strep by PCR: DETECTED — AB

## 2018-12-20 MED ORDER — PREDNISONE 20 MG PO TABS: 40.0000 mg | ORAL_TABLET | Freq: Every day | ORAL | 0 refills | Status: DC

## 2018-12-20 MED ORDER — AMOXICILLIN 500 MG PO CAPS: 500.0000 mg | ORAL_CAPSULE | Freq: Three times a day (TID) | ORAL | 0 refills | Status: DC

## 2018-12-20 MED ORDER — PREDNISONE 20 MG PO TABS
60.0000 mg | ORAL_TABLET | Freq: Once | ORAL | Status: AC
  Administered 2018-12-20: 60 mg via ORAL
  Filled 2018-12-20: qty 3

## 2018-12-20 MED ORDER — AMOXICILLIN 500 MG PO CAPS
500.0000 mg | ORAL_CAPSULE | Freq: Once | ORAL | Status: AC
  Administered 2018-12-20: 500 mg via ORAL
  Filled 2018-12-20: qty 1

## 2018-12-20 NOTE — ED Triage Notes (Signed)
Pt c/o sore throat x2 days 

## 2018-12-20 NOTE — ED Provider Notes (Signed)
[facility] EMERGENCY DEPARTMENT Provider Note   CSN: [id number] Arrival date & time: 12/20/18  0001     History   Chief Complaint Chief Complaint  Patient presents with  . Sore Throat    HPI Arthur Mcdonald is a 28 y.o. male.  The history is provided by the patient. No language interpreter was used.  Sore Throat  This is a new problem. The current episode started 2 days ago. The problem occurs constantly. The problem has been gradually worsening. The symptoms are aggravated by swallowing. Nothing relieves the symptoms. He has tried acetaminophen for the symptoms.    Past Medical History:  Diagnosis Date  . ADHD (attention deficit hyperactivity disorder)   . Anxiety   . Bipolar disorder (HCC)   . Skin infection     There are no active problems to display for this patient.   Past Surgical History:  Procedure Laterality Date  . 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 Authorizing Provider  amoxicillin (AMOXIL) 500 MG capsule Take 1 capsule (500 mg total) by mouth 3 (three) times daily. 12/20/18   Roxy Horseman, PA-C  azithromycin (ZITHROMAX Z-PAK) 250 MG tablet 2 po day one, then 1 daily x 4 days Patient not taking: Reported on 09/29/2018 01/27/18   Mancel Bale, MD  cyclobenzaprine (FLEXERIL) 5 MG tablet Take 1 tablet (5 mg total) by mouth 3 (three) times daily as needed. Patient not taking: Reported on 09/29/2018 09/07/18   Fayrene Helper, PA-C  methocarbamol (ROBAXIN) 500 MG tablet Take 1 tablet (500 mg total) by mouth 2 (two) times daily. 09/29/18   Maxwell Caul, PA-C  naproxen (NAPROSYN) 500 MG tablet Take 1 tablet (500 mg total) by mouth 2 (two) times daily. Patient not taking: Reported on 09/29/2018 09/07/18   Fayrene Helper, PA-C  predniSONE (DELTASONE) 20 MG tablet Take 2 tablets (40 mg total) by mouth daily. 12/20/18   Roxy Horseman, PA-C  salicylic acid 6 % gel Apply topically daily. Apply to wart Patient not taking: Reported on 09/29/2018 03/10/17   Lavera Guise, MD  traMADol (ULTRAM) 50 MG tablet Take 1 tablet (50 mg total) by mouth every 6 (six) hours as needed. Patient not taking: Reported on 09/29/2018 11/22/15   Arthor Captain, PA-C    Family History Family History  Problem Relation Age of Onset  . Diabetes Other   . Heart attack Other   . Diabetes Mother     Social History Social History   Tobacco Use  . Smoking status: Current Every Day Smoker    Packs/day: 1.00    Types: Cigarettes  . Smokeless tobacco: Never Used  Substance Use Topics  . Alcohol use: Yes  . Drug use: No     Allergies   Other   Review of Systems Review of Systems  All other systems reviewed and are negative.    Physical Exam Updated Vital Signs BP 131/74 (BP Location: Right Arm)   Pulse 88   Temp 98.1 F (36.7 C) (Oral)   Resp 17   Ht 5\' 7"  (1.702 m)   Wt 93 kg   SpO2 96%   BMI 32.11 kg/m   Physical Exam Physical Exam  Constitutional: Pt  appears well-developed and well-nourished. No distress.  HENT:  Head: Normocephalic and atraumatic.  Nose: Nose normal. No mucosal edema  or rhinorrhea.  Mouth/Throat: Uvula is midline and mucous membranes are normal. Mucous membranes are not dry. No trismus in the jaw. No uvula swelling. Moderate erythema, no exudates, no abscess Eyes: Conjunctivae are normal.  Neck: Normal range of motion, full passive range of motion without pain and phonation normal. No tracheal tenderness, no spinous process tenderness and no muscular tenderness present. No rigidity. No erythema and normal range of motion present. Range of motion without pain  Normal phonation No stridor Handling secretions without difficulty No nuchal rigidity or meningeal signs  Cardiovascular: Normal rate, regular rhythm and normal heart sounds.   Pulses:      Radial pulses are 2+ on the right side, and  2+ on the left side.  Pulmonary/Chest: Effort normal and breath sounds normal. No stridor. No respiratory distress. No decreased breath sounds.  Musculoskeletal: Normal range of motion.  Neurological: Pt is alert.  Alert and oriented Skin: Skin is warm and dry. Pt is not diaphoretic.  Psychiatric: Normal mood and affect.  Nursing note and vitals reviewed.    ED Treatments / Results  Labs (all labs ordered are listed, but only abnormal results are displayed) Labs Reviewed  GROUP A STREP BY PCR    EKG None  Radiology No results found.  Procedures Procedures (including critical care time)  Medications Ordered in ED Medications  predniSONE (DELTASONE) tablet 60 mg (has no administration in time range)  amoxicillin (AMOXIL) capsule 500 mg (has no administration in time range)     Initial Impression / Assessment and Plan / ED Course  I have reviewed the triage vital signs and the nursing notes.  Pertinent labs & imaging results that were available during my care of the patient were reviewed by me and considered in my medical decision making (see chart for details).     Patient with sore throat x 2 days. No abscess.  Final Clinical Impressions(s) / ED Diagnoses   Final diagnoses:  Pharyngitis, unspecified etiology    ED Discharge Orders         Ordered    amoxicillin (AMOXIL) 500 MG capsule  3 times daily     12/20/18 0028    predniSONE (DELTASONE) 20 MG tablet  Daily     12/20/18 0028           Roxy HorsemanBrowning, Sharmaine Bain, PA-C 12/20/18 0032    Gilda CreasePollina, Christopher J, MD 12/20/18 [id number]

## 2019-04-21 ENCOUNTER — Emergency Department (HOSPITAL_COMMUNITY)
Admission: EM | Admit: 2019-04-21 | Discharge: 2019-04-21 | Disposition: A | Discharge status: Home / Self Care | Payer: Medicaid Other | Attending: Emergency Medicine | Admitting: Emergency Medicine

## 2019-04-21 ENCOUNTER — Emergency Department (HOSPITAL_COMMUNITY): Payer: Medicaid Other

## 2019-04-21 ENCOUNTER — Encounter (HOSPITAL_COMMUNITY): Payer: Self-pay | Admitting: Radiology

## 2019-04-21 ENCOUNTER — Other Ambulatory Visit: Payer: Self-pay

## 2019-04-21 DIAGNOSIS — R110 Nausea: Secondary | ICD-10-CM | POA: Diagnosis not present

## 2019-04-21 DIAGNOSIS — K2900 Acute gastritis without bleeding: Secondary | ICD-10-CM | POA: Diagnosis not present

## 2019-04-21 DIAGNOSIS — F17210 Nicotine dependence, cigarettes, uncomplicated: Secondary | ICD-10-CM | POA: Insufficient documentation

## 2019-04-21 DIAGNOSIS — F319 Bipolar disorder, unspecified: Secondary | ICD-10-CM | POA: Diagnosis not present

## 2019-04-21 DIAGNOSIS — R1013 Epigastric pain: Secondary | ICD-10-CM | POA: Diagnosis present

## 2019-04-21 DIAGNOSIS — F909 Attention-deficit hyperactivity disorder, unspecified type: Secondary | ICD-10-CM | POA: Diagnosis not present

## 2019-04-21 DIAGNOSIS — R1012 Left upper quadrant pain: Secondary | ICD-10-CM | POA: Insufficient documentation

## 2019-04-21 LAB — COMPREHENSIVE METABOLIC PANEL
ALT: 41 U/L (ref 0–44)
AST: 22 U/L (ref 15–41)
Albumin: 4.6 g/dL (ref 3.5–5.0)
Alkaline Phosphatase: 95 U/L (ref 38–126)
Anion gap: 12 (ref 5–15)
BUN: 16 mg/dL (ref 6–20)
CO2: 23 mmol/L (ref 22–32)
Calcium: 9.5 mg/dL (ref 8.9–10.3)
Chloride: 107 mmol/L (ref 98–111)
Creatinine, Ser: 1.17 mg/dL (ref 0.61–1.24)
GFR calc Af Amer: >60 mL/min (ref >60)
GFR calc non Af Amer: >60 mL/min (ref >60)
Glucose, Bld: 103 mg/dL — ABNORMAL HIGH (ref 70–99)
Potassium: 4 mmol/L (ref 3.5–5.1)
Sodium: 142 mmol/L (ref 135–145)
Total Bilirubin: 0.7 mg/dL (ref 0.3–1.2)
Total Protein: 7 g/dL (ref 6.5–8.1)

## 2019-04-21 LAB — CBC
HCT: 52.7 % — ABNORMAL HIGH (ref 39.0–52.0)
Hemoglobin: 18.1 g/dL — ABNORMAL HIGH (ref 13.0–17.0)
MCH: 30 pg (ref 26.0–34.0)
MCHC: 34.3 g/dL (ref 30.0–36.0)
MCV: 87.3 fL (ref 80.0–100.0)
Platelets: 279 10*3/uL (ref 150–400)
RBC: 6.04 MIL/uL — ABNORMAL HIGH (ref 4.22–5.81)
RDW: 12.6 % (ref 11.5–15.5)
WBC: 18.5 10*3/uL — ABNORMAL HIGH (ref 4.0–10.5)
nRBC: 0 % (ref 0.0–0.2)

## 2019-04-21 LAB — URINALYSIS, ROUTINE W REFLEX MICROSCOPIC
Bilirubin Urine: NEGATIVE
Glucose, UA: NEGATIVE mg/dL
Hgb urine dipstick: NEGATIVE
Ketones, ur: NEGATIVE mg/dL
Leukocytes,Ua: NEGATIVE
Nitrite: NEGATIVE
Protein, ur: NEGATIVE mg/dL
Specific Gravity, Urine: 1.03 (ref 1.005–1.030)
pH: 5 (ref 5.0–8.0)

## 2019-04-21 LAB — LIPASE, BLOOD: Lipase: 25 U/L (ref 11–51)

## 2019-04-21 MED ORDER — DICYCLOMINE HCL 20 MG PO TABS: 20.0000 mg | ORAL_TABLET | Freq: Two times a day (BID) | ORAL | 0 refills | Status: AC

## 2019-04-21 MED ORDER — LIDOCAINE VISCOUS HCL 2 % MT SOLN
15.0000 mL | Freq: Once | OROMUCOSAL | Status: AC
  Administered 2019-04-21: 04:00:00 15 mL via ORAL
  Filled 2019-04-21: qty 15

## 2019-04-21 MED ORDER — SODIUM CHLORIDE 0.9% FLUSH
3.0000 mL | Freq: Once | INTRAVENOUS | Status: AC
  Administered 2019-04-21: 04:00:00 3 mL via INTRAVENOUS

## 2019-04-21 MED ORDER — ONDANSETRON HCL 4 MG/2ML IJ SOLN
4.0000 mg | Freq: Once | INTRAMUSCULAR | Status: AC
  Administered 2019-04-21: 04:00:00 4 mg via INTRAVENOUS
  Filled 2019-04-21: qty 2

## 2019-04-21 MED ORDER — ALUM & MAG HYDROXIDE-SIMETH 200-200-20 MG/5ML PO SUSP
30.0000 mL | Freq: Once | ORAL | Status: AC
  Administered 2019-04-21: 04:00:00 30 mL via ORAL
  Filled 2019-04-21: qty 30

## 2019-04-21 MED ORDER — KETOROLAC TROMETHAMINE 15 MG/ML IJ SOLN
15.0000 mg | Freq: Once | INTRAMUSCULAR | Status: AC
  Administered 2019-04-21: 15 mg via INTRAVENOUS
  Filled 2019-04-21: qty 1

## 2019-04-21 MED ORDER — OMEPRAZOLE 20 MG PO CPDR: 20.0000 mg | DELAYED_RELEASE_CAPSULE | Freq: Every day | ORAL | 0 refills | Status: AC

## 2019-04-21 MED ORDER — SODIUM CHLORIDE 0.9 % IV BOLUS
500.0000 mL | Freq: Once | INTRAVENOUS | Status: AC
  Administered 2019-04-21: 05:00:00 500 mL via INTRAVENOUS

## 2019-04-21 MED ORDER — IOHEXOL 300 MG/ML  SOLN
100.0000 mL | Freq: Once | INTRAMUSCULAR | Status: AC | PRN
  Administered 2019-04-21: 04:00:00 100 mL via INTRAVENOUS

## 2019-04-21 MED ORDER — DICYCLOMINE HCL 10 MG/ML IM SOLN
20.0000 mg | Freq: Once | INTRAMUSCULAR | Status: AC
  Administered 2019-04-21: 04:00:00 20 mg via INTRAMUSCULAR
  Filled 2019-04-21: qty 2

## 2019-04-21 NOTE — ED Notes (Signed)
Patient transported to CT 

## 2019-04-21 NOTE — ED Provider Notes (Signed)
[facility] Provider Note   CSN: [id number] Arrival date & time: 04/21/19  0239    History   Chief Complaint Chief Complaint  Patient presents with  . Abdominal Pain    HPI Mate Monico BlitzR Oehlert is a 28 y.o. male.     The history is provided by the patient.  Abdominal Pain  Pain location:  Epigastric Pain quality: sharp   Pain radiates to:  LUQ and periumbilical region Pain severity:  Severe Onset quality:  Sudden Timing:  Constant Progression:  Unchanged Chronicity:  New Context: eating   Context comment:  Chicken nuggets Relieved by:  Nothing Worsened by:  Nothing Ineffective treatments:  None tried Associated symptoms: nausea   Associated symptoms: no anorexia, no chest pain, no chills, no constipation, no cough, no diarrhea, no dysuria, no fatigue, no fever, no flatus, no hematuria, no shortness of breath, no sore throat and no vomiting   Risk factors: no alcohol abuse     Past Medical History:  Diagnosis Date  . ADHD (attention deficit hyperactivity disorder)   . Anxiety   . Bipolar disorder (HCC)   . Skin infection     There are no active problems to display for this patient.   Past Surgical History:  Procedure Laterality Date  . 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 Authorizing Provider  amoxicillin (AMOXIL) 500 MG capsule Take 1 capsule (500 mg total) by mouth 3 (three) times daily. 12/20/18   Roxy HorsemanBrowning, Robert, PA-C  azithromycin (ZITHROMAX Z-PAK) 250 MG tablet 2 po day one, then 1 daily x 4 days Patient not taking: Reported on 09/29/2018 01/27/18   Mancel BaleWentz, Elliott, MD  cyclobenzaprine (FLEXERIL) 5 MG tablet Take 1 tablet (5 mg total) by mouth 3 (three) times daily as needed. Patient not taking: Reported on 09/29/2018 09/07/18   Fayrene Helperran, Bowie, PA-C  methocarbamol (ROBAXIN) 500 MG tablet Take 1 tablet (500 mg  total) by mouth 2 (two) times daily. 09/29/18   Maxwell CaulLayden, Lindsey A, PA-C  naproxen (NAPROSYN) 500 MG tablet Take 1 tablet (500 mg total) by mouth 2 (two) times daily. Patient not taking: Reported on 09/29/2018 09/07/18   Fayrene Helperran, Bowie, PA-C  predniSONE (DELTASONE) 20 MG tablet Take 2 tablets (40 mg total) by mouth daily. 12/20/18   Roxy HorsemanBrowning, Robert, PA-C  salicylic acid 6 % gel Apply topically daily. Apply to wart Patient not taking: Reported on 09/29/2018 03/10/17   Lavera GuiseLiu, Dana Duo, MD  traMADol (ULTRAM) 50 MG tablet Take 1 tablet (50 mg total) by mouth every 6 (six) hours as needed. Patient not taking: Reported on 09/29/2018 11/22/15   Arthor CaptainHarris, Abigail, PA-C    Family History Family History  Problem Relation Age of Onset  . Diabetes Other   . Heart attack Other   . Diabetes Mother     Social History Social History   Tobacco Use  . Smoking status: Current Every Day Smoker    Packs/day: 1.00    Types: Cigarettes  . Smokeless tobacco: Never Used  Substance Use Topics  . Alcohol use: Yes  . Drug use: No     Allergies   Other   Review of Systems Review of Systems  Constitutional: Negative for chills, fatigue and fever.  HENT: Negative for sore throat.   Respiratory: Negative for cough and shortness of breath.   Cardiovascular:  Negative for chest pain.  Gastrointestinal: Positive for abdominal pain and nausea. Negative for anorexia, constipation, diarrhea, flatus and vomiting.  Genitourinary: Negative for dysuria and hematuria.  All other systems reviewed and are negative.    Physical Exam Updated Vital Signs BP 125/77   Pulse 77   Temp 97.8 F (36.6 C) (Oral)   Resp 17   SpO2 93%   Physical Exam Vitals signs and nursing note reviewed.  Constitutional:      General: He is not in acute distress.    Appearance: He is normal weight.  HENT:     Head: Normocephalic and atraumatic.     Nose: Nose normal.  Eyes:     Conjunctiva/sclera: Conjunctivae normal.     Pupils:  Pupils are equal, round, and reactive to light.  Neck:     Musculoskeletal: Normal range of motion and neck supple.  Cardiovascular:     Rate and Rhythm: Normal rate and regular rhythm.     Pulses: Normal pulses.     Heart sounds: Normal heart sounds.  Pulmonary:     Effort: Pulmonary effort is normal.     Breath sounds: Normal breath sounds.  Abdominal:     General: Abdomen is flat. Bowel sounds are normal.     Tenderness: There is no abdominal tenderness. There is no guarding or rebound.     Hernia: No hernia is present.  Musculoskeletal: Normal range of motion.  Skin:    General: Skin is warm and dry.     Capillary Refill: Capillary refill takes less than 2 seconds.  Neurological:     General: No focal deficit present.     Mental Status: He is alert and oriented to person, place, and time.  Psychiatric:        Mood and Affect: Mood normal.        Behavior: Behavior normal.      ED Treatments / Results  Labs (all labs ordered are listed, but only abnormal results are displayed) Results for orders placed or performed during the hospital encounter of 04/21/19  Lipase, blood  Result Value Ref Range   Lipase 25 11 - 51 U/L  Comprehensive metabolic panel  Result Value Ref Range   Sodium 142 135 - 145 mmol/L   Potassium 4.0 3.5 - 5.1 mmol/L   Chloride 107 98 - 111 mmol/L   CO2 23 22 - 32 mmol/L   Glucose, Bld 103 (H) 70 - 99 mg/dL   BUN 16 6 - 20 mg/dL   Creatinine, Ser 1.61 0.61 - 1.24 mg/dL   Calcium 9.5 8.9 - 09.6 mg/dL   Total Protein 7.0 6.5 - 8.1 g/dL   Albumin 4.6 3.5 - 5.0 g/dL   AST 22 15 - 41 U/L   ALT 41 0 - 44 U/L   Alkaline Phosphatase 95 38 - 126 U/L   Total Bilirubin 0.7 0.3 - 1.2 mg/dL   GFR calc non Af Amer >60 >60 mL/min   GFR calc Af Amer >60 >60 mL/min   Anion gap 12 5 - 15  CBC  Result Value Ref Range   WBC 18.5 (H) 4.0 - 10.5 K/uL   RBC 6.04 (H) 4.22 - 5.81 MIL/uL   Hemoglobin 18.1 (H) 13.0 - 17.0 g/dL   HCT 04.5 (H) 40.9 - 81.1 %   MCV  87.3 80.0 - 100.0 fL   MCH 30.0 26.0 - 34.0 pg   MCHC 34.3 30.0 - 36.0 g/dL   RDW 91.4 78.2 - 95.6 %  Platelets 279 150 - 400 K/uL   nRBC 0.0 0.0 - 0.2 %  Urinalysis, Routine w reflex microscopic  Result Value Ref Range   Color, Urine YELLOW YELLOW   APPearance CLEAR CLEAR   Specific Gravity, Urine 1.030 1.005 - 1.030   pH 5.0 5.0 - 8.0   Glucose, UA NEGATIVE NEGATIVE mg/dL   Hgb urine dipstick NEGATIVE NEGATIVE   Bilirubin Urine NEGATIVE NEGATIVE   Ketones, ur NEGATIVE NEGATIVE mg/dL   Protein, ur NEGATIVE NEGATIVE mg/dL   Nitrite NEGATIVE NEGATIVE   Leukocytes,Ua NEGATIVE NEGATIVE   Ct Abdomen Pelvis W Contrast  Result Date: 04/21/2019 CLINICAL DATA:  28 year old male with abdominal pain since 0100 hours. EXAM: CT ABDOMEN AND PELVIS WITH CONTRAST TECHNIQUE: Multidetector CT imaging of the abdomen and pelvis was performed using the standard protocol following bolus administration of intravenous contrast. CONTRAST:  OMNIPAQUE IOHEXOL 300 MG/ML  SOLN COMPARISON:  Chest CTA 01/27/2018. Report of CT Abdomen and Pelvis 01/21/2001 (no images available). FINDINGS: Lower chest: Lower lobe mosaic attenuation similar to the prior CTA, favor mild gas trapping and atelectasis. Normal heart size. No pericardial or pleural effusion. Hepatobiliary: Negative liver and gallbladder. No bile duct enlargement. Pancreas: Hypoplastic pancreatic tail, otherwise negative. Spleen: Negative. Adrenals/Urinary Tract: Normal adrenal glands. Symmetric and normal renal enhancement. No hydronephrosis or perinephric stranding. No nephrolithiasis identified. Decompressed and normal ureters. Diminutive and unremarkable urinary bladder. Stomach/Bowel: Negative descending and rectosigmoid colon. Mild retained stool. No transverse or right colon inflammation. Normal appendix on series 3 image 58 which tracks to the midline. Fluid-filled but nondilated distal small bowel and terminal ileum. No small bowel mesenteric  stranding. More proximal small bowel loops appear normal. Negative stomach. No free air, free fluid. Vascular/Lymphatic: Major arterial structures are patent and appear normal. Portal venous system is patent. No lymphadenopathy. Reproductive: Negative. Other: No pelvic free fluid. 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 No overt pars fracture. No spondylolisthesis, and otherwise negative. IMPRESSION: Normal appendix and essentially negative CT abdomen and pelvis. No acute or inflammatory process identified. Electronically Signed   By: Odessa Fleming M.D.   On: 04/21/2019 05:04    Radiology Ct Abdomen Pelvis W Contrast  Result Date: 04/21/2019 CLINICAL DATA:  28 year old male with abdominal pain since 0100 hours. EXAM: CT ABDOMEN AND PELVIS WITH CONTRAST TECHNIQUE: Multidetector CT imaging of the abdomen and pelvis was performed using the standard protocol following bolus administration of intravenous contrast. CONTRAST:  OMNIPAQUE IOHEXOL 300 MG/ML  SOLN COMPARISON:  Chest CTA 01/27/2018. Report of CT Abdomen and Pelvis 01/21/2001 (no images available). FINDINGS: Lower chest: Lower lobe mosaic attenuation similar to the prior CTA, favor mild gas trapping and atelectasis. Normal heart size. No pericardial or pleural effusion. Hepatobiliary: Negative liver and gallbladder. No bile duct enlargement. Pancreas: Hypoplastic pancreatic tail, otherwise negative. Spleen: Negative. Adrenals/Urinary Tract: Normal adrenal glands. Symmetric and normal renal enhancement. No hydronephrosis or perinephric stranding. No nephrolithiasis identified. Decompressed and normal ureters. Diminutive and unremarkable urinary bladder. Stomach/Bowel: Negative descending and rectosigmoid colon. Mild retained stool. No transverse or right colon inflammation. Normal appendix on series 3 image 58 which tracks to the midline. Fluid-filled but nondilated distal small bowel and terminal ileum. No small bowel mesenteric  stranding. More proximal small bowel loops appear normal. Negative stomach. No free air, free fluid. Vascular/Lymphatic: Major arterial structures are patent and appear normal. Portal venous system is patent. No lymphadenopathy. Reproductive: Negative. Other: No pelvic free fluid. 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 No  overt pars fracture. No spondylolisthesis, and otherwise negative. IMPRESSION: Normal appendix and essentially negative CT abdomen and pelvis. No acute or inflammatory process identified. Electronically Signed   By: Odessa Fleming M.D.   On: 04/21/2019 05:04    Procedures Procedures (including critical care time)  Medications Ordered in ED Medications  sodium chloride flush (NS) 0.9 % injection 3 mL (3 mLs Intravenous Given 04/21/19 0355)  ondansetron (ZOFRAN) injection 4 mg (4 mg Intravenous Given 04/21/19 0352)  alum & mag hydroxide-simeth (MAALOX/MYLANTA) 200-200-20 MG/5ML suspension 30 mL (30 mLs Oral Given 04/21/19 0352)    And  lidocaine (XYLOCAINE) 2 % viscous mouth solution 15 mL (15 mLs Oral Given 04/21/19 0352)  dicyclomine (BENTYL) injection 20 mg (20 mg Intramuscular Given 04/21/19 0352)  sodium chloride 0.9 % bolus 500 mL (500 mLs Intravenous New Bag/Given 04/21/19 0437)  ketorolac (TORADOL) 15 MG/ML injection 15 mg (15 mg Intravenous Given 04/21/19 0420)  iohexol (OMNIPAQUE) 300 MG/ML solution 100 mL (100 mLs Intravenous Contrast Given 04/21/19 0425)     Will treat for gastritis, well post medication. PO challenged in the department.  Will start bland diet and Prilosec  Final Clinical Impressions(s) / ED Diagnoses   Return for intractable cough, coughing up blood,fevers >100.4 unrelieved by medication, shortness of breath, intractable vomiting, chest pain, shortness of breath, weakness,numbness, changes in speech, facial asymmetry,abdominal pain, passing out,Inability to tolerate liquids or food, cough, altered mental status or any concerns. No  signs of systemic illness or infection. The patient is nontoxic-appearing on exam and vital signs are within normal limits.   I have reviewed the triage vital signs and the nursing notes. Pertinent labs &imaging results that were available during my care of the patient were reviewed by me and considered in my medical decision making (see chart for details).  After history, exam, and medical workup I feel the patient has been appropriately medically screened and is safe for discharge home. Pertinent diagnoses were discussed with the patient. Patient was given return precautions.   Ares Cardozo, MD 04/21/19 1610

## 2019-04-21 NOTE — ED Notes (Signed)
Pt able to tolerate 2 cups of water without any complaint

## 2019-04-21 NOTE — ED Triage Notes (Signed)
Tonight patient developed sudden onset 10/10 upper episgastric stabbing that has not resolved. Pt does admit nauzeune? Which provided no relief.... afebrile, no n/v/d

## 2019-05-28 ENCOUNTER — Emergency Department (HOSPITAL_COMMUNITY)
Admission: EM | Admit: 2019-05-28 | Discharge: 2019-05-28 | Disposition: A | Discharge status: Home / Self Care | Payer: Medicaid Other | Attending: Emergency Medicine | Admitting: Emergency Medicine

## 2019-05-28 ENCOUNTER — Other Ambulatory Visit: Payer: Self-pay

## 2019-05-28 DIAGNOSIS — Q068 Other specified congenital malformations of spinal cord: Secondary | ICD-10-CM

## 2019-05-28 DIAGNOSIS — F17210 Nicotine dependence, cigarettes, uncomplicated: Secondary | ICD-10-CM | POA: Diagnosis not present

## 2019-05-28 DIAGNOSIS — L0231 Cutaneous abscess of buttock: Secondary | ICD-10-CM | POA: Insufficient documentation

## 2019-05-28 DIAGNOSIS — L0291 Cutaneous abscess, unspecified: Secondary | ICD-10-CM

## 2019-05-28 DIAGNOSIS — Z79899 Other long term (current) drug therapy: Secondary | ICD-10-CM | POA: Diagnosis not present

## 2019-05-28 MED ORDER — CEPHALEXIN 500 MG PO CAPS: 500.0000 mg | ORAL_CAPSULE | Freq: Four times a day (QID) | ORAL | 0 refills | Status: DC

## 2019-05-28 NOTE — ED Provider Notes (Signed)
[facility] Provider Note   CSN: [id number] Arrival date & time: 05/28/19  1254    History   Chief Complaint No chief complaint on file.   HPI Arthur Mcdonald is a 28 y.o. male presenting today stating drainage from his buttock region x 2 years and possible abscess x 3 days.   Pt states that he has had drainage that is clear, green or yellow from his buttock x 2 years.   States his wife saw this recently and became concerned and wanted pt to see a physician.   Pt states that over the past 3 days, he now has noticed a painful mass to right cheek.  Denies fever, abd pain, rectal pain/bleeding, testicular pain.  No distress.      HPI  Past Medical History:  Diagnosis Date  . ADHD (attention deficit hyperactivity disorder)   . Anxiety   . Bipolar disorder (HCC)   . Skin infection     There are no active problems to display for this patient.   Past Surgical History:  Procedure Laterality Date  . 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 Authorizing Provider  amoxicillin (AMOXIL) 500 MG capsule Take 1 capsule (500 mg total) by mouth 3 (three) times daily. 12/20/18   Roxy HorsemanBrowning, Robert, PA-C  azithromycin (ZITHROMAX Z-PAK) 250 MG tablet 2 po day one, then 1 daily x 4 days Patient not taking: Reported on 09/29/2018 01/27/18   Mancel BaleWentz, Elliott, MD  cephALEXin (KEFLEX) 500 MG capsule Take 1 capsule (500 mg total) by mouth 4 (four) times daily. 05/28/19   Nichalos Brenton K, PA-C  cyclobenzaprine (FLEXERIL) 5 MG tablet Take 1 tablet (5 mg total) by mouth 3 (three) times daily as needed. Patient not taking: Reported on 09/29/2018 09/07/18   Fayrene Helperran, Bowie, PA-C  dicyclomine (BENTYL) 20 MG tablet Take 1 tablet (20 mg total) by mouth 2 (two) times daily. 04/21/19   Palumbo, April, MD  methocarbamol (ROBAXIN) 500 MG tablet Take 1 tablet (500 mg total) by  mouth 2 (two) times daily. 09/29/18   Maxwell CaulLayden, Lindsey A, PA-C  naproxen (NAPROSYN) 500 MG tablet Take 1 tablet (500 mg total) by mouth 2 (two) times daily. Patient not taking: Reported on 09/29/2018 09/07/18   Fayrene Helperran, Bowie, PA-C  omeprazole (PRILOSEC) 20 MG capsule Take 1 capsule (20 mg total) by mouth daily. 04/21/19   Palumbo, April, MD  predniSONE (DELTASONE) 20 MG tablet Take 2 tablets (40 mg total) by mouth daily. 12/20/18   Roxy HorsemanBrowning, Robert, PA-C  salicylic acid 6 % gel Apply topically daily. Apply to wart Patient not taking: Reported on 09/29/2018 03/10/17   Lavera GuiseLiu, Dana Duo, MD  traMADol (ULTRAM) 50 MG tablet Take 1 tablet (50 mg total) by mouth every 6 (six) hours as needed. Patient not taking: Reported on 09/29/2018 11/22/15   Arthor CaptainHarris, Abigail, PA-C    Family History Family History  Problem Relation Age of Onset  . Diabetes Other   . Heart attack Other   . Diabetes Mother     Social History Social History   Tobacco Use  . Smoking status: Current Every Day Smoker    Packs/day: 1.00    Types: Cigarettes  . Smokeless tobacco: Never Used  Substance Use Topics  . Alcohol use: Yes  . Drug use: No     Allergies  Other   Review of Systems Review of Systems  Constitutional: Negative for chills and fever.  Gastrointestinal: Negative for abdominal pain, anal bleeding, diarrhea, nausea, rectal pain and vomiting.  Genitourinary: Negative for frequency and testicular pain.  Skin: Positive for wound.  Neurological: Negative for numbness.     Physical Exam Updated Vital Signs BP (!) 146/93 (BP Location: Right Arm)   Pulse 92   Temp 97.6 F (36.4 C) (Oral)   Resp 18   Ht 5\' 7"  (1.702 m)   Wt 95.3 kg   SpO2 99%   BMI 32.89 kg/m   Physical Exam Vitals signs and nursing note reviewed.  Constitutional:      General: He is not in acute distress.    Appearance: Normal appearance. He is not diaphoretic.  HENT:     Head: Normocephalic and atraumatic.  Eyes:     General:         Right eye: No discharge.        Left eye: No discharge.  Cardiovascular:     Rate and Rhythm: Normal rate and regular rhythm.  Pulmonary:     Effort: Pulmonary effort is normal. No respiratory distress.  Abdominal:     General: There is no distension.     Tenderness: There is no abdominal tenderness.  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     Coordination: Coordination normal.  Psychiatric:        Mood and Affect: Mood normal.        Behavior: Behavior normal.      ED Treatments / Results  Labs (all labs ordered are listed, but only abnormal results are displayed) Labs Reviewed - No data to display  EKG None  Radiology No results found.  Procedures Procedures (including critical care time)  Medications Ordered in ED Medications - No data to display   Initial Impression / Assessment and Plan / ED Course  I have reviewed the triage vital signs and the nursing notes.  Pertinent labs & imaging results that were available during my care of the patient were reviewed by me and considered in my medical decision making (see chart for details).  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  Will start pt on keflex for abscess to buttock.  No clinical indication for drainage at this time.    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

## 2019-05-28 NOTE — ED Triage Notes (Signed)
2 week hx of possible abcess at lower spine/ upper buttocks with pain noted with sitting.  No drainage at this time.

## 2019-05-28 NOTE — Discharge Instructions (Signed)
Please follow up with general surgery regarding closure of congenital sinus tract.

## 2019-06-07 ENCOUNTER — Emergency Department (HOSPITAL_COMMUNITY)
Admission: EM | Admit: 2019-06-07 | Discharge: 2019-06-07 | Disposition: A | Discharge status: Home / Self Care | Payer: Medicaid Other | Attending: Emergency Medicine | Admitting: Emergency Medicine

## 2019-06-07 ENCOUNTER — Other Ambulatory Visit: Payer: Self-pay

## 2019-06-07 DIAGNOSIS — S39012A Strain of muscle, fascia and tendon of lower back, initial encounter: Secondary | ICD-10-CM

## 2019-06-07 DIAGNOSIS — F17210 Nicotine dependence, cigarettes, uncomplicated: Secondary | ICD-10-CM | POA: Insufficient documentation

## 2019-06-07 DIAGNOSIS — Z79899 Other long term (current) drug therapy: Secondary | ICD-10-CM | POA: Diagnosis not present

## 2019-06-07 DIAGNOSIS — Y9389 Activity, other specified: Secondary | ICD-10-CM | POA: Diagnosis not present

## 2019-06-07 DIAGNOSIS — S29002A Unspecified injury of muscle and tendon of back wall of thorax, initial encounter: Secondary | ICD-10-CM | POA: Diagnosis not present

## 2019-06-07 DIAGNOSIS — Y990 Civilian activity done for income or pay: Secondary | ICD-10-CM | POA: Insufficient documentation

## 2019-06-07 DIAGNOSIS — Y9289 Other specified places as the place of occurrence of the external cause: Secondary | ICD-10-CM | POA: Insufficient documentation

## 2019-06-07 DIAGNOSIS — M549 Dorsalgia, unspecified: Secondary | ICD-10-CM | POA: Diagnosis present

## 2019-06-07 DIAGNOSIS — X500XXA Overexertion from strenuous movement or load, initial encounter: Secondary | ICD-10-CM | POA: Diagnosis not present

## 2019-06-07 MED ORDER — METHOCARBAMOL 500 MG PO TABS: 500.0000 mg | ORAL_TABLET | Freq: Every evening | ORAL | 0 refills | Status: DC | PRN

## 2019-06-07 NOTE — ED Notes (Signed)
Patient Alert and oriented to baseline. Stable and ambulatory to baseline. Patient verbalized understanding of the discharge instructions.  Patient belongings were taken by the patient.   

## 2019-06-07 NOTE — ED Provider Notes (Signed)
[facility] EMERGENCY DEPARTMENT Provider Note   CSN: [id number] Arrival date & time: 06/07/19  1832     History   Chief Complaint No chief complaint on file.   HPI Arthur Mcdonald is a 28 y.o. male presenting for evaluation of back pain.   Pt states he has had chronic back pain for 5 yrs. He was at work today when he twisted his back and had acute worsening of pain of his right mid back.  Pain is constant, worse when he is standing or sitting up, improved when he is laying down.  He has not taken anything for pain including Tylenol ibuprofen, though he did have one Aleve and one Tylenol prior to this occurring.  He denies fall or trauma.  He denies numbness or tingling.  He denies fevers, chills, loss of bowel bladder control, history of cancer, history of IVDU.      HPI  Past Medical History:  Diagnosis Date  . ADHD (attention deficit hyperactivity disorder)   . Anxiety   . Bipolar disorder ([place])   . Skin infection     There are no active problems to display for this patient.   Past Surgical History:  Procedure Laterality Date  . 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 Authorizing Provider  amoxicillin (AMOXIL) 500 MG capsule Take 1 capsule (500 mg total) by mouth 3 (three) times daily. 12/20/18   Montine Circle, PA-C  azithromycin (ZITHROMAX Z-PAK) 250 MG tablet 2 po day one, then 1 daily x 4 days Patient not taking: Reported on 09/29/2018 01/27/18   Daleen Bo, MD  cephALEXin (KEFLEX) 500 MG capsule Take 1 capsule (500 mg total) by mouth 4 (four) times daily. 05/28/19   Nodal, Amber K, PA-C  cyclobenzaprine (FLEXERIL) 5 MG tablet Take 1 tablet (5 mg total) by mouth 3 (three) times daily as needed. Patient not taking: Reported on 09/29/2018 09/07/18   Domenic Moras, PA-C  dicyclomine (BENTYL) 20 MG tablet Take 1 tablet (20 mg total) by mouth  2 (two) times daily. 04/21/19   Palumbo, April, MD  methocarbamol (ROBAXIN) 500 MG tablet Take 1 tablet (500 mg total) by mouth at bedtime as needed for muscle spasms. 06/07/19   Adia Crammer, PA-C  naproxen (NAPROSYN) 500 MG tablet Take 1 tablet (500 mg total) by mouth 2 (two) times daily. Patient not taking: Reported on 09/29/2018 09/07/18   Domenic Moras, PA-C  omeprazole (PRILOSEC) 20 MG capsule Take 1 capsule (20 mg total) by mouth daily. 04/21/19   Palumbo, April, MD  predniSONE (DELTASONE) 20 MG tablet Take 2 tablets (40 mg total) by mouth daily. 12/20/18   Montine Circle, PA-C  salicylic acid 6 % gel Apply topically daily. Apply to wart Patient not taking: Reported on 09/29/2018 03/10/17   Forde Dandy, MD  traMADol (ULTRAM) 50 MG tablet Take 1 tablet (50 mg total) by mouth every 6 (six) hours as needed. Patient not taking: Reported on 09/29/2018 11/22/15   Margarita Mail, PA-C    Family History Family History  Problem Relation Age of Onset  . Diabetes Other   . Heart attack Other   . Diabetes Mother     Social History Social History   Tobacco Use  . Smoking status: Current Every Day Smoker    Packs/day: 1.00    Types: Cigarettes  .  Smokeless tobacco: Never Used  Substance Use Topics  . Alcohol use: Yes  . Drug use: No     Allergies   Other   Review of Systems Review of Systems  Musculoskeletal: Positive for back pain.  Neurological: Negative for numbness.  Hematological: Does not bruise/bleed easily.     Physical Exam Updated Vital Signs BP 126/85 (BP Location: Right Arm)   Pulse 79   Temp 97.9 F (36.6 C) (Oral)   Resp 18   Ht 5\' 7"  (1.702 m)   SpO2 100%   BMI 32.89 kg/m   Physical Exam Vitals signs and nursing note reviewed.  Constitutional:      General: He is not in acute distress.    Appearance: He is well-developed.     Comments: Appears nontoxic  HENT:     Head: Normocephalic and atraumatic.  Neck:     Musculoskeletal: Normal range of  motion.  Cardiovascular:     Rate and Rhythm: Normal rate and regular rhythm.     Pulses: Normal pulses.  Pulmonary:     Effort: Pulmonary effort is normal.     Breath sounds: Normal breath sounds.  Abdominal:     General: There is no distension.     Palpations: Abdomen is soft. There is no mass.     Tenderness: There is no abdominal tenderness. There is no guarding or rebound.  Musculoskeletal: Normal range of motion.       Back:     Comments: TTP of R mid-back musculature. no ttp over midline spine. No step offs or deformities. No ttp elsewhere on the back. Negative SLR. Strength of lower extremities intact bilaterally. Sensation intact bilaterally. Pt ambulatory.  Skin:    General: Skin is warm.     Capillary Refill: Capillary refill takes less than 2 seconds.     Findings: No rash.  Neurological:     Mental Status: He is alert and oriented to person, place, and time.      ED Treatments / Results  Labs (all labs ordered are listed, but only abnormal results are displayed) Labs Reviewed - No data to display  EKG    Radiology No results found.  Procedures Procedures (including critical care time)  Medications Ordered in ED Medications - No data to display   Initial Impression / Assessment and Plan / ED Course  I have reviewed the triage vital signs and the nursing notes.  Pertinent labs & imaging results that were available during my care of the patient were reviewed by me and considered in my medical decision making (see chart for details).        Patient presenting for evaluation of R sided mid-back pain.  Physical exam reassuring, neurovascularly intact.  No red flags for back pain.  Pain is reproducible with palpation of the musculature.  Likely musculoskeletal.  No fever or cough, doubt pneumonia or lung cause.  Doubt fracture, I do not believe x-rays will be beneficial.  Doubt vertebral injury, infection, spinal cord compression, myelopathy, or cauda equina  syndrome.  Will treat symptomatically with NSAIDs, muscle relaxers, muscle creams.  Patient given information to follow-up with orthopedics.  At this time, patient appears safe for discharge.  Return precautions given.  Patient states he understands agrees to plan.   Final Clinical Impressions(s) / ED Diagnoses   Final diagnoses:  Back strain, initial encounter    ED Discharge Orders         Ordered    methocarbamol (ROBAXIN) 500 MG  tablet  At bedtime PRN     06/07/19 1958           Alveria ApleyCaccavale, Cortney Beissel, PA-C 06/07/19 2103    Terrilee FilesButler, Michael C, MD 06/08/19 [phone]

## 2019-06-07 NOTE — Discharge Instructions (Signed)
Take naproxen (aka aleve) 2 times a day with meals.  Do not take other anti-inflammatories at the same time (Advil, Motrin, ibuprofen). You may supplement with Tylenol if you need further pain control. Use Robaxin as needed for muscle stiffness or soreness. Have caution, as this may make you tired or groggy. Do not drive or operate heavy machinery while taking this medication.  Use muscle creams (bengay, icy hot, salonpas) as needed for pain.  Do the back stretches to help with your pain. Follow up with orthopedics for further evaluation and management of your ongoing back pain.  Return to the ER if you develop high fevers, numbness, loss of bowel or bladder control, or any new or concerning symptoms.

## 2019-09-03 ENCOUNTER — Emergency Department (HOSPITAL_COMMUNITY): Payer: Medicaid Other

## 2019-09-03 ENCOUNTER — Encounter (HOSPITAL_COMMUNITY): Payer: Self-pay

## 2019-09-03 ENCOUNTER — Emergency Department (HOSPITAL_COMMUNITY)
Admission: EM | Admit: 2019-09-03 | Discharge: 2019-09-03 | Disposition: A | Discharge status: Home / Self Care | Payer: Medicaid Other | Attending: Emergency Medicine | Admitting: Emergency Medicine

## 2019-09-03 ENCOUNTER — Other Ambulatory Visit: Payer: Self-pay

## 2019-09-03 DIAGNOSIS — X500XXA Overexertion from strenuous movement or load, initial encounter: Secondary | ICD-10-CM | POA: Diagnosis not present

## 2019-09-03 DIAGNOSIS — M25512 Pain in left shoulder: Secondary | ICD-10-CM | POA: Insufficient documentation

## 2019-09-03 DIAGNOSIS — F319 Bipolar disorder, unspecified: Secondary | ICD-10-CM | POA: Diagnosis not present

## 2019-09-03 DIAGNOSIS — S4992XA Unspecified injury of left shoulder and upper arm, initial encounter: Secondary | ICD-10-CM | POA: Diagnosis present

## 2019-09-03 DIAGNOSIS — Y9289 Other specified places as the place of occurrence of the external cause: Secondary | ICD-10-CM | POA: Diagnosis not present

## 2019-09-03 DIAGNOSIS — Z79899 Other long term (current) drug therapy: Secondary | ICD-10-CM | POA: Diagnosis not present

## 2019-09-03 DIAGNOSIS — F17210 Nicotine dependence, cigarettes, uncomplicated: Secondary | ICD-10-CM | POA: Diagnosis not present

## 2019-09-03 DIAGNOSIS — Y990 Civilian activity done for income or pay: Secondary | ICD-10-CM | POA: Diagnosis not present

## 2019-09-03 DIAGNOSIS — Y9389 Activity, other specified: Secondary | ICD-10-CM | POA: Insufficient documentation

## 2019-09-03 MED ORDER — NAPROXEN 500 MG PO TABS: 500.0000 mg | ORAL_TABLET | Freq: Two times a day (BID) | ORAL | 0 refills | Status: AC | PRN

## 2019-09-03 NOTE — Discharge Instructions (Addendum)
It was my pleasure taking care of you today!  Naproxen as needed for pain.  You can also take Tylenol over-the-counter as needed for additional pain relief.  Ice shoulder throughout the day (instructions below).  Wear shoulder sling for no more than 3 days, then begin performing gentle range of motion exercises.   Call the orthopedist listed on Monday to schedule a follow up appointment.  Return to the ER for new or worsening symptoms, any additional concerns.  COLD THERAPY DIRECTIONS:  Ice or gel packs can be used to reduce both pain and swelling. Ice is the most helpful within the first 24 to 48 hours after an injury or flareup from overusing a muscle or joint.  Ice is effective, has very few side effects, and is safe for most people to use.   If you expose your skin to cold temperatures for too long or without the proper protection, you can damage your skin or nerves. Watch for signs of skin damage due to cold.   HOME CARE INSTRUCTIONS  Follow these tips to use ice and cold packs safely.  Place a dry or damp towel between the ice and skin. A damp towel will cool the skin more quickly, so you may need to shorten the time that the ice is used.  For a more rapid response, add gentle compression to the ice.  Ice for no more than 10 to 20 minutes at a time. The bonier the area you are icing, the less time it will take to get the benefits of ice.  Check your skin after 5 minutes to make sure there are no signs of a poor response to cold or skin damage.  Rest 20 minutes or more in between uses.  Once your skin is numb, you can end your treatment. You can test numbness by very lightly touching your skin. The touch should be so light that you do not see the skin dimple from the pressure of your fingertip. When using ice, most people will feel these normal sensations in this order: cold, burning, aching, and numbness.

## 2019-09-03 NOTE — ED Provider Notes (Signed)
[facility] EMERGENCY DEPARTMENT Provider Note   CSN: [id number] Arrival date & time: 09/03/19  1947     History   Chief Complaint Chief Complaint  Patient presents with  . Shoulder Pain    HPI Arthur Mcdonald is a 28 y.o. male.     The history is provided by the patient and medical records. No language interpreter was used.  Shoulder Pain  Arthur Mcdonald is a 28 y.o. male  with a PMH as listed below who presents to the Emergency Department complaining of acute onset of left shoulder pain while lifting a heavy box at work yesterday.  He states that he went to throw the box and had acute onset of pain and heard a pop to the shoulder.  Since then, pain is worse with certain movements and any type of lifting.  No medications taken prior to arrival for symptoms.  No numbness or weakness.  No neck pain.   Past Medical History:  Diagnosis Date  . ADHD (attention deficit hyperactivity disorder)   . Anxiety   . Bipolar disorder ([place])   . Skin infection     There are no active problems to display for this patient.   Past Surgical History:  Procedure Laterality Date  . 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 Authorizing Provider  amoxicillin (AMOXIL) 500 MG capsule Take 1 capsule (500 mg total) by mouth 3 (three) times daily. 12/20/18   Montine Circle, PA-C  azithromycin (ZITHROMAX Z-PAK) 250 MG tablet 2 po day one, then 1 daily x 4 days Patient not taking: Reported on 09/29/2018 01/27/18   Daleen Bo, MD  cephALEXin (KEFLEX) 500 MG capsule Take 1 capsule (500 mg total) by mouth 4 (four) times daily. 05/28/19   Nodal, Amber K, PA-C  cyclobenzaprine (FLEXERIL) 5 MG tablet Take 1 tablet (5 mg total) by mouth 3 (three) times daily as needed. Patient not taking: Reported on 09/29/2018 09/07/18   Domenic Moras, PA-C  dicyclomine (BENTYL) 20 MG tablet Take 1  tablet (20 mg total) by mouth 2 (two) times daily. 04/21/19   Palumbo, April, MD  methocarbamol (ROBAXIN) 500 MG tablet Take 1 tablet (500 mg total) by mouth at bedtime as needed for muscle spasms. 06/07/19   Caccavale, Sophia, PA-C  naproxen (NAPROSYN) 500 MG tablet Take 1 tablet (500 mg total) by mouth 2 (two) times daily as needed for mild pain or moderate pain. 09/03/19   , Ozella Almond, PA-C  omeprazole (PRILOSEC) 20 MG capsule Take 1 capsule (20 mg total) by mouth daily. 04/21/19   Palumbo, April, MD  predniSONE (DELTASONE) 20 MG tablet Take 2 tablets (40 mg total) by mouth daily. 12/20/18   Montine Circle, PA-C  salicylic acid 6 % gel Apply topically daily. Apply to wart Patient not taking: Reported on 09/29/2018 03/10/17   Forde Dandy, MD  traMADol (ULTRAM) 50 MG tablet Take 1 tablet (50 mg total) by mouth every 6 (six) hours as needed. Patient not taking: Reported on 09/29/2018 11/22/15   Margarita Mail, PA-C    Family History Family History  Problem Relation Age of Onset  . Diabetes Other   . Heart attack Other   . Diabetes Mother     Social History Social History   Tobacco Use  . Smoking status: Current Every Day Smoker  Packs/day: 1.00    Types: Cigarettes  . Smokeless tobacco: Never Used  Substance Use Topics  . Alcohol use: Yes  . Drug use: No     Allergies   Other   Review of Systems Review of Systems  Musculoskeletal: Positive for arthralgias and myalgias.  Skin: Negative for color change and wound.  Neurological: Negative for weakness and numbness.     Physical Exam Updated Vital Signs BP 131/74   Pulse 69   Temp 98.4 F (36.9 C) (Oral)   Resp 16   SpO2 96%   Physical Exam Vitals signs and nursing note reviewed.  Constitutional:      General: He is not in acute distress.    Appearance: He is well-developed.  HENT:     Head: Normocephalic and atraumatic.  Neck:     Musculoskeletal: Neck supple.     Comments: No midline or paraspinal  tenderness. Cardiovascular:     Rate and Rhythm: Normal rate and regular rhythm.     Heart sounds: Normal heart sounds. No murmur.  Pulmonary:     Effort: Pulmonary effort is normal. No respiratory distress.     Breath sounds: Normal breath sounds. No wheezing or rales.  Musculoskeletal:     Comments: Tenderness to the left shoulder.  Full range of motion.  No crepitus or step-off.  2+ radial pulse.  Sensation intact to radial, ulnar and median nerve distributions.  Skin:    General: Skin is warm and dry.  Neurological:     Mental Status: He is alert.      ED Treatments / Results  Labs (all labs ordered are listed, but only abnormal results are displayed) Labs Reviewed - No data to display  EKG None  Radiology Dg Shoulder Left  Result Date: 09/03/2019 CLINICAL DATA:  Left shoulder pain after lifting heavy boxes yesterday. EXAM: LEFT SHOULDER - 2+ VIEW COMPARISON:  Radiographs of September 07, 2018. FINDINGS: There is no evidence of fracture or dislocation. There is no evidence of arthropathy or other focal bone abnormality. Soft tissues are unremarkable. IMPRESSION: Negative. Electronically Signed   By: Lupita Raider M.D.   On: 09/03/2019 20:31    Procedures Procedures (including critical care time)  Medications Ordered in ED Medications - No data to display   Initial Impression / Assessment and Plan / ED Course  I have reviewed the triage vital signs and the nursing notes.  Pertinent labs & imaging results that were available during my care of the patient were reviewed by me and considered in my medical decision making (see chart for details).        Arthur Mcdonald is a 28 y.o. male who presents to ED for acute onset of left shoulder pain after injury while at work yesterday lifting/throwing a heavy box.  Neurovascularly intact on exam.  X-ray negative.  Will place in sling, start on naproxen and have him follow-up with orthopedics.  Home care instructions and  follow-up plan was discussed and all questions were answered.   Final Clinical Impressions(s) / ED Diagnoses   Final diagnoses:  Acute pain of left shoulder    ED Discharge Orders         Ordered    naproxen (NAPROSYN) 500 MG tablet  2 times daily PRN     09/03/19 2129           , Chase Picket, PA-C 09/03/19 2143    Melene Plan, DO 09/03/19 2206

## 2019-09-03 NOTE — ED Triage Notes (Signed)
Pt reports left shoulder pain after hearing a pop while lifting heavy boxes at work yesterday. +ROM no deformity noted.

## 2019-09-09 ENCOUNTER — Encounter (HOSPITAL_COMMUNITY): Payer: Self-pay

## 2019-09-09 ENCOUNTER — Other Ambulatory Visit: Payer: Self-pay

## 2019-09-09 ENCOUNTER — Emergency Department (HOSPITAL_COMMUNITY)
Admission: EM | Admit: 2019-09-09 | Discharge: 2019-09-09 | Disposition: A | Discharge status: Home / Self Care | Payer: Medicaid Other | Attending: Emergency Medicine | Admitting: Emergency Medicine

## 2019-09-09 DIAGNOSIS — R112 Nausea with vomiting, unspecified: Secondary | ICD-10-CM | POA: Insufficient documentation

## 2019-09-09 DIAGNOSIS — Z20828 Contact with and (suspected) exposure to other viral communicable diseases: Secondary | ICD-10-CM | POA: Diagnosis not present

## 2019-09-09 DIAGNOSIS — F319 Bipolar disorder, unspecified: Secondary | ICD-10-CM | POA: Diagnosis not present

## 2019-09-09 DIAGNOSIS — F17210 Nicotine dependence, cigarettes, uncomplicated: Secondary | ICD-10-CM | POA: Insufficient documentation

## 2019-09-09 DIAGNOSIS — R519 Headache, unspecified: Secondary | ICD-10-CM | POA: Diagnosis present

## 2019-09-09 DIAGNOSIS — Z79899 Other long term (current) drug therapy: Secondary | ICD-10-CM | POA: Diagnosis not present

## 2019-09-09 DIAGNOSIS — F909 Attention-deficit hyperactivity disorder, unspecified type: Secondary | ICD-10-CM | POA: Diagnosis not present

## 2019-09-09 LAB — CBC
HCT: 50.6 % (ref 39.0–52.0)
Hemoglobin: 17.6 g/dL — ABNORMAL HIGH (ref 13.0–17.0)
MCH: 31.4 pg (ref 26.0–34.0)
MCHC: 34.8 g/dL (ref 30.0–36.0)
MCV: 90.4 fL (ref 80.0–100.0)
Platelets: 264 10*3/uL (ref 150–400)
RBC: 5.6 MIL/uL (ref 4.22–5.81)
RDW: 12.4 % (ref 11.5–15.5)
WBC: 11.4 10*3/uL — ABNORMAL HIGH (ref 4.0–10.5)
nRBC: 0 % (ref 0.0–0.2)

## 2019-09-09 LAB — BASIC METABOLIC PANEL
Anion gap: 11 (ref 5–15)
BUN: 11 mg/dL (ref 6–20)
CO2: 23 mmol/L (ref 22–32)
Calcium: 9.2 mg/dL (ref 8.9–10.3)
Chloride: 107 mmol/L (ref 98–111)
Creatinine, Ser: 1.1 mg/dL (ref 0.61–1.24)
GFR calc Af Amer: >60 mL/min (ref >60)
GFR calc non Af Amer: >60 mL/min (ref >60)
Glucose, Bld: 92 mg/dL (ref 70–99)
Potassium: 3.8 mmol/L (ref 3.5–5.1)
Sodium: 141 mmol/L (ref 135–145)

## 2019-09-09 LAB — MAGNESIUM: Magnesium: 1.9 mg/dL (ref 1.7–2.4)

## 2019-09-09 MED ORDER — DIPHENHYDRAMINE HCL 50 MG/ML IJ SOLN
12.5000 mg | Freq: Once | INTRAMUSCULAR | Status: AC
  Administered 2019-09-09: 12.5 mg via INTRAVENOUS
  Filled 2019-09-09: qty 1

## 2019-09-09 MED ORDER — BUTALBITAL-APAP-CAFFEINE 50-325-40 MG PO TABS: 1.0000 | ORAL_TABLET | Freq: Four times a day (QID) | ORAL | 0 refills | Status: AC | PRN

## 2019-09-09 MED ORDER — DEXAMETHASONE SODIUM PHOSPHATE 4 MG/ML IJ SOLN
4.0000 mg | Freq: Once | INTRAMUSCULAR | Status: AC
  Administered 2019-09-09: 16:00:00 4 mg via INTRAVENOUS
  Filled 2019-09-09: qty 1

## 2019-09-09 MED ORDER — SODIUM CHLORIDE 0.9 % IV BOLUS
1000.0000 mL | Freq: Once | INTRAVENOUS | Status: AC
  Administered 2019-09-09: 1000 mL via INTRAVENOUS

## 2019-09-09 MED ORDER — ONDANSETRON HCL 4 MG/2ML IJ SOLN
4.0000 mg | Freq: Once | INTRAMUSCULAR | Status: AC
  Administered 2019-09-09: 4 mg via INTRAVENOUS
  Filled 2019-09-09: qty 2

## 2019-09-09 MED ORDER — KETOROLAC TROMETHAMINE 15 MG/ML IJ SOLN
15.0000 mg | Freq: Once | INTRAMUSCULAR | Status: AC
  Administered 2019-09-09: 16:00:00 15 mg via INTRAVENOUS
  Filled 2019-09-09: qty 1

## 2019-09-09 NOTE — ED Notes (Signed)
Patient verbalizes understanding of discharge instructions. Opportunity for questioning and answers were provided. Armband removed by staff, pt discharged from ED.  

## 2019-09-09 NOTE — Discharge Instructions (Addendum)
Please call your dentist as discussed for your appointment. Please call [facility] for follow up on today's visit. Use tylenol and ibuprofen for headache as needed. I have also prescribed you fioricet to use for your headaches.

## 2019-09-09 NOTE — ED Provider Notes (Signed)
[facility] EMERGENCY DEPARTMENT Provider Note   CSN: [id number] Arrival date & time: 09/09/19  1427     History   Chief Complaint Chief Complaint  Patient presents with  . Headache    HPI Arthur Mcdonald is a 28 y.o. male with history of anxiety, migraine diagnosed during childhood formally used Imitrex but current prescription due to not PCP.  Patient presents for 4 days of gradual onset of left-sided headache  that is similar to prior migraines but is persistent and not mitigated by medication with ibuprofen and Tylenol and Excedrin.  Patient states pain is constant, worse with noise and lights better with rest.  However patient states he is unable to sleep last 2 nights due to pain.   States he was at work today and vomited x2 nonbloody nonbilious due to his migraine feeling worse.  Of note, patient states that he believes he was exposed ago at work and like to be screened.  No symptoms of COVID at this time other than headache and nausea.    Patient denies neck pain, worse headache of life, syncope, neck stiffness, trauma, sensation changes or weakness, vision changes, dizziness or lightheadedness.  Patient denies any worse headache at night, denies any carbon monoxide exposure, neck injury, fever or chills.     HPI  Past Medical History:  Diagnosis Date  . ADHD (attention deficit hyperactivity disorder)   . Anxiety   . Bipolar disorder (HCC)   . Skin infection     There are no active problems to display for this patient.   Past Surgical History:  Procedure Laterality Date  . 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 Authorizing Provider  amoxicillin (AMOXIL) 500 MG capsule Take 1 capsule (500 mg total) by mouth 3 (three) times daily. 12/20/18   Roxy Horseman, PA-C  azithromycin (ZITHROMAX Z-PAK) 250 MG tablet 2 po day one, then 1 daily  x 4 days Patient not taking: Reported on 09/29/2018 01/27/18   Mancel Bale, MD  cephALEXin (KEFLEX) 500 MG capsule Take 1 capsule (500 mg total) by mouth 4 (four) times daily. 05/28/19   Nodal, Amber K, PA-C  cyclobenzaprine (FLEXERIL) 5 MG tablet Take 1 tablet (5 mg total) by mouth 3 (three) times daily as needed. Patient not taking: Reported on 09/29/2018 09/07/18   Fayrene Helper, PA-C  dicyclomine (BENTYL) 20 MG tablet Take 1 tablet (20 mg total) by mouth 2 (two) times daily. 04/21/19   Palumbo, April, MD  methocarbamol (ROBAXIN) 500 MG tablet Take 1 tablet (500 mg total) by mouth at bedtime as needed for muscle spasms. 06/07/19   Caccavale, Sophia, PA-C  naproxen (NAPROSYN) 500 MG tablet Take 1 tablet (500 mg total) by mouth 2 (two) times daily as needed for mild pain or moderate pain. 09/03/19   Ward, Chase Picket, PA-C  omeprazole (PRILOSEC) 20 MG capsule Take 1 capsule (20 mg total) by mouth daily. 04/21/19   Palumbo, April, MD  predniSONE (DELTASONE) 20 MG tablet Take 2 tablets (40 mg total) by mouth daily. 12/20/18   Roxy Horseman, PA-C  salicylic acid 6 % gel Apply topically daily. Apply to wart Patient not taking: Reported on 09/29/2018 03/10/17   Lavera Guise, MD  traMADol (ULTRAM) 50 MG tablet Take 1 tablet (50 mg total) by mouth every 6 (six) hours as needed. Patient  not taking: Reported on 09/29/2018 11/22/15   Margarita Mail, PA-C    Family History Family History  Problem Relation Age of Onset  . Diabetes Other   . Heart attack Other   . Diabetes Mother     Social History Social History   Tobacco Use  . Smoking status: Current Every Day Smoker    Packs/day: 1.00    Types: Cigarettes  . Smokeless tobacco: Never Used  Substance Use Topics  . Alcohol use: Yes  . Drug use: No     Allergies   Other   Review of Systems Review of Systems  All other systems reviewed and are negative.    Physical Exam Updated Vital Signs BP 128/78   Pulse 86   Temp 99 F (37.2  C) (Oral)   Resp 16   Ht 5\' 7"  (1.702 m)   Wt 97.5 kg   SpO2 100%   BMI 33.67 kg/m   Physical Exam Vitals signs and nursing note reviewed.  Constitutional:      General: He is in acute distress.  HENT:     Head: Normocephalic and atraumatic.     Nose: Nose normal.     Mouth/Throat:     Mouth: Mucous membranes are moist.     Comments: Poor dentition Eyes:     General: No scleral icterus.    Extraocular Movements: Extraocular movements intact.     Pupils: Pupils are equal, round, and reactive to light.  Neck:     Musculoskeletal: Normal range of motion.  Cardiovascular:     Rate and Rhythm: Normal rate and regular rhythm.     Pulses: Normal pulses.     Heart sounds: Normal heart sounds.  Pulmonary:     Effort: Pulmonary effort is normal. No respiratory distress.     Breath sounds: No wheezing.  Abdominal:     Palpations: Abdomen is soft.     Tenderness: There is no abdominal tenderness.  Musculoskeletal:     Right lower leg: No edema.     Left lower leg: No edema.     Comments: Strength 5/5 bilateral upper and lower extremities  Skin:    General: Skin is warm and dry.     Capillary Refill: Capillary refill takes less than 2 seconds.  Neurological:     Mental Status: He is alert. Mental status is at baseline.     Comments: Alert and oriented to self, place, time and event.  Speech is fluent, clear without dysarthria or dysphasia.  Strength 5/5 in upper/lower extremities  Sensation intact in upper/lower extremities  CN I not tested  CN II grossly intact visual fields bilaterally. Did not visualize posterior eye.   CN III, IV, VI PERRLA and EOMs intact bilaterally  CN V Intact sensation to light touch to the face  CN VII facial movements symmetric  CN VIII not tested  CN IX, X no uvula deviation, symmetric rise of soft palate  CN XI 5/5 SCM and trapezius strength bilaterally  CN XII Midline tongue protrusion, symmetric L/R movements   Psychiatric:        Mood and  Affect: Mood normal.        Behavior: Behavior normal.      ED Treatments / Results  Labs (all labs ordered are listed, but only abnormal results are displayed) Labs Reviewed - No data to display  EKG None  Radiology No results found.  Procedures Procedures (including critical care time)  Medications Ordered in ED Medications -  No data to display   Initial Impression / Assessment and Plan / ED Course  I have reviewed the triage vital signs and the nursing notes.  Pertinent labs & imaging results that were available during my care of the patient were reviewed by me and considered in my medical decision making (see chart for details).        Patient is [age]-year-old male with history of diagnosed migraines in the past and gradual onset of similar feeling migraine that patient states is not well controlled with Tylenol ibuprofen or Excedrin.  Patient has history of migraines in the past with similar feeling and no neurologic deficits today low suspicion for intracranial process.  Will treat with migraine cocktail and IV fluids and reassess.  On reassessment patient states his migraine is vastly improved and that he feels well return like to go to sleep.  Patient has vitals within normal limits during his ED visit.  I personally rechecked patient's temperature during assessment and temperature was 98.6.  Patient states he has not felt feverish at a time.  Patient given strict return precautions and states that he just got insurance again and will be following up with primary care soon.  Patient given resources for primary care as well as dentist as he has poor dentition and chronic dental pain.      The patient appears reasonably screened and/or stabilized for discharge and I doubt any other medical condition or other [facility] requiring further screening, evaluation, or treatment in the ED at this time prior to discharge.  Patient is hemodynamically stable, in NAD, and able to  ambulate in the ED. Pain has been managed or a plan has been made for home management and has no complaints prior to discharge. Patient is comfortable with above plan and is stable for discharge at this time. All questions were answered prior to disposition. Results from the ER workup discussed with the patient face to face and all questions answered to the best of my ability. The patient is safe for discharge with strict return precautions. Patient appears safe for discharge with appropriate follow-up.  Conveyed my impression with the patient and he voiced understanding and is agreeable to plan.   An After Visit Summary was printed and given to the patient.  Portions of this note were generated with Scientist, clinical (histocompatibility and immunogenetics)Dragon dictation software. Dictation errors may occur despite best attempts at proofreading.    Final Clinical Impressions(s) / ED Diagnoses   Final diagnoses:  None    ED Discharge Orders    None       Gailen ShelterFondaw, Hadja Harral S, [place] 09/09/19 2047    Benjiman CorePickering, Nathan, MD 09/17/19 [phone]

## 2019-09-09 NOTE — ED Triage Notes (Signed)
Pt arrives POV for eval of HA since Monday. Pt reports hx of migraines, states this feels similar, but his typical migraines are responsive to OTC meds. This one has been refractory to all meds he has taken at home including excedrin, aleve, motrin and tylenol. Pt denies visual changes, head trauma, thinners or known injury. Neuro intact. Pt also reports poss covid exposure from work from 2 different people. Asymptomatic aside from HA

## 2019-09-10 LAB — NOVEL CORONAVIRUS, NAA (HOSP ORDER, SEND-OUT TO REF LAB; TAT 18-24 HRS): SARS-CoV-2, NAA: NOT DETECTED

## 2019-11-24 ENCOUNTER — Other Ambulatory Visit: Payer: Self-pay

## 2019-11-24 ENCOUNTER — Encounter (HOSPITAL_COMMUNITY): Payer: Self-pay | Admitting: Emergency Medicine

## 2019-11-24 ENCOUNTER — Emergency Department (HOSPITAL_COMMUNITY)
Admission: EM | Admit: 2019-11-24 | Discharge: 2019-11-24 | Disposition: A | Discharge status: Home / Self Care | Payer: Medicaid Other | Attending: Emergency Medicine | Admitting: Emergency Medicine

## 2019-11-24 DIAGNOSIS — Z79899 Other long term (current) drug therapy: Secondary | ICD-10-CM | POA: Insufficient documentation

## 2019-11-24 DIAGNOSIS — F17210 Nicotine dependence, cigarettes, uncomplicated: Secondary | ICD-10-CM | POA: Diagnosis not present

## 2019-11-24 DIAGNOSIS — Z20822 Contact with and (suspected) exposure to covid-19: Secondary | ICD-10-CM

## 2019-11-24 DIAGNOSIS — R519 Headache, unspecified: Secondary | ICD-10-CM | POA: Insufficient documentation

## 2019-11-24 DIAGNOSIS — R05 Cough: Secondary | ICD-10-CM | POA: Insufficient documentation

## 2019-11-24 DIAGNOSIS — Z20828 Contact with and (suspected) exposure to other viral communicable diseases: Secondary | ICD-10-CM | POA: Diagnosis not present

## 2019-11-24 LAB — POC SARS CORONAVIRUS 2 AG -  ED: SARS Coronavirus 2 Ag: NEGATIVE

## 2019-11-24 NOTE — Discharge Instructions (Addendum)
As discussed, your rapid Covid test was negative.  Your outpatient Covid test is pending.  Continue to self quarantine until your results become available.  Covid is a viral infection which does not require any antibiotics.  You may take over-the-counter Tylenol or ibuprofen if you develop a fever.  Follow-up with your PCP if her symptoms do not improve within the next week.  Return to the ER for new or worsening symptoms.

## 2019-11-24 NOTE — ED Provider Notes (Signed)
[facility] EMERGENCY DEPARTMENT Provider Note   CSN: [id number] Arrival date & time: 11/24/19  1127     History Chief Complaint  Patient presents with  . Cough    Arthur Mcdonald is a 28 y.o. male with a past medical history significant for bipolar disorder, anxiety, and ADHD who presents to the ED due to productive cough and headache x1 day.  Patient states his wife tested positive for Covid yesterday.  Patient admits to intermittent, worsening productive cough with clear/green sputum production. Patient states he has a chronic cough "due to smoking". Patient also admits to a bilateral frontal headache for the past day.  Patient denies changes to vision, nausea, and vomiting.  Patient states "this seems to be the easiest and quickest way to get a Covid test."  Patient has not tried anything for symptoms at home prior to arrival.  Patient denies shortness of breath and chest pain.  Patient denies abdominal pain, fever, and chills.    Past Medical History:  Diagnosis Date  . ADHD (attention deficit hyperactivity disorder)   . Anxiety   . Bipolar disorder ([place])   . Skin infection     There are no problems to display for this patient.   Past Surgical History:  Procedure Laterality Date  . Birthmark removed from right side of head         Family History  Problem Relation Age of Onset  . Diabetes Other   . Heart attack Other   . Diabetes Mother     Social History   Tobacco Use  . Smoking status: Current Every Day Smoker    Packs/day: 1.00    Types: Cigarettes  . Smokeless tobacco: Never Used  Substance Use Topics  . Alcohol use: Yes  . Drug use: No    Home Medications Prior to Admission medications   Medication Sig Start Date End Date Taking? Authorizing Provider  amoxicillin (AMOXIL) 500 MG capsule Take 1 capsule (500 mg total) by mouth 3 (three) times daily. 12/20/18   Montine Circle, PA-C  azithromycin (ZITHROMAX Z-PAK) 250 MG tablet 2 po day  one, then 1 daily x 4 days Patient not taking: Reported on 09/29/2018 01/27/18   Daleen Bo, MD  butalbital-acetaminophen-caffeine (FIORICET) [phone] MG tablet Take 1-2 tablets by mouth every 6 (six) hours as needed for headache. 09/09/19 09/08/20  Tedd Sias, PA  cephALEXin (KEFLEX) 500 MG capsule Take 1 capsule (500 mg total) by mouth 4 (four) times daily. 05/28/19   Nodal, Amber K, PA-C  cyclobenzaprine (FLEXERIL) 5 MG tablet Take 1 tablet (5 mg total) by mouth 3 (three) times daily as needed. Patient not taking: Reported on 09/29/2018 09/07/18   Domenic Moras, PA-C  dicyclomine (BENTYL) 20 MG tablet Take 1 tablet (20 mg total) by mouth 2 (two) times daily. 04/21/19   Palumbo, April, MD  methocarbamol (ROBAXIN) 500 MG tablet Take 1 tablet (500 mg total) by mouth at bedtime as needed for muscle spasms. 06/07/19   Caccavale, Sophia, PA-C  naproxen (NAPROSYN) 500 MG tablet Take 1 tablet (500 mg total) by mouth 2 (two) times daily as needed for mild pain or moderate pain. 09/03/19   Ward, Ozella Almond, PA-C  omeprazole (PRILOSEC) 20 MG capsule Take 1 capsule (20 mg total) by mouth daily. 04/21/19   Palumbo, April, MD  predniSONE (DELTASONE) 20 MG tablet Take 2 tablets (40 mg total) by mouth daily. 12/20/18   Montine Circle, PA-C  salicylic acid 6 % gel Apply  topically daily. Apply to wart Patient not taking: Reported on 09/29/2018 03/10/17   Lavera Guise, MD  traMADol (ULTRAM) 50 MG tablet Take 1 tablet (50 mg total) by mouth every 6 (six) hours as needed. Patient not taking: Reported on 09/29/2018 11/22/15   Arthor Captain, PA-C    Allergies    Other  Review of Systems   Review of Systems  Constitutional: Negative for chills and fever.  HENT: Negative for congestion, ear pain, rhinorrhea and sore throat.   Respiratory: Positive for cough. Negative for shortness of breath.   Cardiovascular: Negative for chest pain.  Gastrointestinal: Negative for abdominal pain, diarrhea, nausea and  vomiting.  Neurological: Positive for headaches. Negative for dizziness and numbness.    Physical Exam Updated Vital Signs BP 118/68 (BP Location: Left Arm)   Pulse 62   Temp 98.4 F (36.9 C) (Oral)   Resp 18   SpO2 100%   Physical Exam Vitals and nursing note reviewed.  Constitutional:      General: He is not in acute distress.    Appearance: He is not ill-appearing.  HENT:     Head: Normocephalic.     Nose: Nose normal.     Mouth/Throat:     Mouth: Mucous membranes are moist.     Pharynx: Oropharynx is clear. No oropharyngeal exudate or posterior oropharyngeal erythema.  Eyes:     Pupils: Pupils are equal, round, and reactive to light.  Neck:     Comments: No meningismus Cardiovascular:     Rate and Rhythm: Normal rate and regular rhythm.     Pulses: Normal pulses.     Heart sounds: Normal heart sounds. No murmur. No friction rub. No gallop.   Pulmonary:     Effort: Pulmonary effort is normal.     Breath sounds: Normal breath sounds.     Comments: Respirations equal and unlabored, patient able to speak in full sentences, lungs clear to auscultation bilaterally Abdominal:     General: Abdomen is flat. There is no distension.     Palpations: Abdomen is soft.     Tenderness: There is no abdominal tenderness. There is no guarding or rebound.  Musculoskeletal:     Cervical back: Neck supple. No rigidity or tenderness.  Neurological:     General: No focal deficit present.     Mental Status: He is alert.     Comments: Speech is clear, able to follow commands CN III-XII intact Normal strength in upper and lower extremities bilaterally including dorsiflexion and plantar flexion, strong and equal grip strength Sensation grossly intact throughout Moves extremities without ataxia, coordination intact No pronator drift Ambulates without difficulty     ED Results / Procedures / Treatments   Labs (all labs ordered are listed, but only abnormal results are displayed) Labs  Reviewed  NOVEL CORONAVIRUS, NAA (HOSP ORDER, SEND-OUT TO REF LAB; TAT 18-24 HRS)  POC SARS CORONAVIRUS 2 AG -  ED    EKG None  Radiology No results found.  Procedures Procedures (including critical care time)  Medications Ordered in ED Medications - No data to display  ED Course  I have reviewed the triage vital signs and the nursing notes.  Pertinent labs & imaging results that were available during my care of the patient were reviewed by me and considered in my medical decision making (see chart for details).     28 year old male presents to the ED for evaluation of cough and headache x1 day.  Patient's wife just tested  positive for Covid. Vitals all within normal limits.  Patient in no acute distress and non-ill appearing.  Lungs clear to auscultation bilaterally.  Abdomen soft, nondistended, nontender.  Normal neurological exam. Doubt emergent headache causes. Rapid Covid test negative.  Will send for PCR test. Suspect symptoms could be related to COVID infection. Patient advised to self quarantine until results become available.  Patient offered prescription cough medication, but deferred stating he has cough medication at home.  Patient advised to follow-up with his PCP if symptoms do not improved within the next week. Strict ED precautions discussed with patient. Patient states understanding and agrees to plan. Patient discharged home in no acute distress and stable vitals   Arthur Mcdonald was evaluated in Emergency Department on 11/24/2019 for the symptoms described in the history of present illness. He was evaluated in the context of the global COVID-19 pandemic, which necessitated consideration that the patient might be at risk for infection with the SARS-CoV-2 virus that causes COVID-19. Institutional protocols and algorithms that pertain to the evaluation of patients at risk for COVID-19 are in a state of rapid change based on information released by regulatory bodies including  the CDC and federal and state organizations. These policies and algorithms were followed during the patient's care in the ED.   MDM Rules/Calculators/A&P                      Final Clinical Impression(s) / ED Diagnoses Final diagnoses:  Suspected COVID-19 virus infection    Rx / DC Orders ED Discharge Orders    None       Mannie Stabileberman, Ladarryl Wrage C, PA-C 11/24/19 1508    Virgina Norfolkuratolo, Adam, DO 11/24/19 1513

## 2019-11-24 NOTE — ED Triage Notes (Signed)
Pt to ER for evaluation of cough with minimal production and headache x1 day. Reports wife tested positive for COVID yesterday and he believes he has been infected. He is in NAD. Requests to be tested.

## 2019-11-24 NOTE — ED Notes (Signed)
Pt states he went to [facility] but he had to have it done quicker for work  States he had a neg test several months ago

## 2019-11-25 LAB — NOVEL CORONAVIRUS, NAA (HOSP ORDER, SEND-OUT TO REF LAB; TAT 18-24 HRS): SARS-CoV-2, NAA: NOT DETECTED

## 2019-12-07 ENCOUNTER — Emergency Department (HOSPITAL_COMMUNITY): Payer: Medicaid Other

## 2019-12-07 ENCOUNTER — Encounter (HOSPITAL_COMMUNITY): Payer: Self-pay | Admitting: Emergency Medicine

## 2019-12-07 ENCOUNTER — Other Ambulatory Visit: Payer: Self-pay

## 2019-12-07 ENCOUNTER — Emergency Department (HOSPITAL_COMMUNITY)
Admission: EM | Admit: 2019-12-07 | Discharge: 2019-12-07 | Discharge status: Against Medical Advice | Payer: Medicaid Other | Attending: Emergency Medicine | Admitting: Emergency Medicine

## 2019-12-07 DIAGNOSIS — Z5321 Procedure and treatment not carried out due to patient leaving prior to being seen by health care provider: Secondary | ICD-10-CM | POA: Diagnosis not present

## 2019-12-07 DIAGNOSIS — R200 Anesthesia of skin: Secondary | ICD-10-CM | POA: Diagnosis present

## 2019-12-07 LAB — DIFFERENTIAL
Abs Immature Granulocytes: 0.03 10*3/uL (ref 0.00–0.07)
Basophils Absolute: 0.1 10*3/uL (ref 0.0–0.1)
Basophils Relative: 1 %
Eosinophils Absolute: 0.3 10*3/uL (ref 0.0–0.5)
Eosinophils Relative: 2 %
Immature Granulocytes: 0 %
Lymphocytes Relative: 32 %
Lymphs Abs: 4.4 10*3/uL — ABNORMAL HIGH (ref 0.7–4.0)
Monocytes Absolute: 1 10*3/uL (ref 0.1–1.0)
Monocytes Relative: 7 %
Neutro Abs: 8.1 10*3/uL — ABNORMAL HIGH (ref 1.7–7.7)
Neutrophils Relative %: 58 %

## 2019-12-07 LAB — COMPREHENSIVE METABOLIC PANEL
ALT: 32 U/L (ref 0–44)
AST: 17 U/L (ref 15–41)
Albumin: 4.1 g/dL (ref 3.5–5.0)
Alkaline Phosphatase: 84 U/L (ref 38–126)
Anion gap: 12 (ref 5–15)
BUN: 17 mg/dL (ref 6–20)
CO2: 25 mmol/L (ref 22–32)
Calcium: 9.3 mg/dL (ref 8.9–10.3)
Chloride: 105 mmol/L (ref 98–111)
Creatinine, Ser: 1.26 mg/dL — ABNORMAL HIGH (ref 0.61–1.24)
GFR calc Af Amer: >60 mL/min (ref >60)
GFR calc non Af Amer: >60 mL/min (ref >60)
Glucose, Bld: 99 mg/dL (ref 70–99)
Potassium: 3.4 mmol/L — ABNORMAL LOW (ref 3.5–5.1)
Sodium: 142 mmol/L (ref 135–145)
Total Bilirubin: 0.6 mg/dL (ref 0.3–1.2)
Total Protein: 6.4 g/dL — ABNORMAL LOW (ref 6.5–8.1)

## 2019-12-07 LAB — CBC
HCT: 49.1 % (ref 39.0–52.0)
Hemoglobin: 16.9 g/dL (ref 13.0–17.0)
MCH: 30.8 pg (ref 26.0–34.0)
MCHC: 34.4 g/dL (ref 30.0–36.0)
MCV: 89.6 fL (ref 80.0–100.0)
Platelets: 270 10*3/uL (ref 150–400)
RBC: 5.48 MIL/uL (ref 4.22–5.81)
RDW: 12.4 % (ref 11.5–15.5)
WBC: 13.9 10*3/uL — ABNORMAL HIGH (ref 4.0–10.5)
nRBC: 0 % (ref 0.0–0.2)

## 2019-12-07 MED ORDER — SODIUM CHLORIDE 0.9% FLUSH: 3.0000 mL | Freq: Once | INTRAVENOUS | Status: DC

## 2019-12-07 NOTE — ED Notes (Signed)
Pt called multiple times by staff to be roomed but no answer.

## 2019-12-07 NOTE — ED Triage Notes (Signed)
Patient reports numbness and tingling at left arm this evening , denies chest pain/no SOB , speech clear/no facial asymmetry with no arm drift , equal strong grips , alert and oriented x4.

## 2019-12-30 ENCOUNTER — Encounter (HOSPITAL_COMMUNITY): Payer: Self-pay | Admitting: Emergency Medicine

## 2019-12-30 ENCOUNTER — Emergency Department (HOSPITAL_COMMUNITY): Payer: Medicaid Other

## 2019-12-30 ENCOUNTER — Emergency Department (HOSPITAL_COMMUNITY)
Admission: EM | Admit: 2019-12-30 | Discharge: 2019-12-30 | Disposition: A | Discharge status: Home / Self Care | Payer: Medicaid Other | Attending: Emergency Medicine | Admitting: Emergency Medicine

## 2019-12-30 ENCOUNTER — Other Ambulatory Visit: Payer: Self-pay

## 2019-12-30 DIAGNOSIS — Y999 Unspecified external cause status: Secondary | ICD-10-CM | POA: Diagnosis not present

## 2019-12-30 DIAGNOSIS — Y92812 Truck as the place of occurrence of the external cause: Secondary | ICD-10-CM | POA: Insufficient documentation

## 2019-12-30 DIAGNOSIS — R040 Epistaxis: Secondary | ICD-10-CM | POA: Diagnosis present

## 2019-12-30 DIAGNOSIS — W228XXA Striking against or struck by other objects, initial encounter: Secondary | ICD-10-CM | POA: Diagnosis not present

## 2019-12-30 DIAGNOSIS — Y9389 Activity, other specified: Secondary | ICD-10-CM | POA: Diagnosis not present

## 2019-12-30 DIAGNOSIS — F17210 Nicotine dependence, cigarettes, uncomplicated: Secondary | ICD-10-CM | POA: Insufficient documentation

## 2019-12-30 NOTE — Discharge Instructions (Addendum)
Please read the attachment on nosebleeds.  Please continue take your ibuprofen 800 mg 3 times per day as needed for your pain discomfort.  It will also help with the inflammation associated with your injury.  Please return to the ED or seek medical attention immediately for any new or worsening symptoms.

## 2019-12-30 NOTE — ED Triage Notes (Signed)
Pt arrives to ED with nose injury that happened at 12pm. Pt states there was a piece of plywood in the bed of his truck hanging off the tailgate. pt states he put his body weight onto the end of the board and caused the other end of the board to swing into his face. Pt states he had a nose bleed and instant swelling and pain. Pt denies any LOC.

## 2019-12-30 NOTE — ED Provider Notes (Signed)
[facility] EMERGENCY DEPARTMENT Provider Note   CSN: [id number] Arrival date & time: 12/30/19  1446     History Chief Complaint  Patient presents with  . Epistaxis  . Facial Injury    Arthur Mcdonald is a 29 y.o. male with recent dental extraction of teeth 5-9 performed 12/28/2019 who presents to the ED after sustaining trauma to the face.  Patient reports that he was taking a piece of plywood out of the bed of his truck when it popped up and caught him in the nose.  He reports that it bled significantly before spontaneously resolving.  He is concerned for a broken nose at this time.  He denies any other pain or injury.  He reports that the incision site for his dental extraction remains undisturbed and intact.  He denies any headache, dizziness, blurred vision, or inability to breathe through his nose.  HPI     Past Medical History:  Diagnosis Date  . ADHD (attention deficit hyperactivity disorder)   . Anxiety   . Bipolar disorder (HCC)   . Skin infection     There are no problems to display for this patient.   Past Surgical History:  Procedure Laterality Date  . Birthmark removed from right side of head         Family History  Problem Relation Age of Onset  . Diabetes Other   . Heart attack Other   . Diabetes Mother     Social History   Tobacco Use  . Smoking status: Current Every Day Smoker    Packs/day: 1.00    Types: Cigarettes  . Smokeless tobacco: Never Used  Substance Use Topics  . Alcohol use: Yes  . Drug use: No    Home Medications Prior to Admission medications   Medication Sig Start Date End Date Taking? Authorizing Provider  amoxicillin (AMOXIL) 500 MG capsule Take 1 capsule (500 mg total) by mouth 3 (three) times daily. 12/20/18   Roxy Horseman, PA-C  azithromycin (ZITHROMAX Z-PAK) 250 MG tablet 2 po day one, then 1 daily x 4 days Patient not taking: Reported on 09/29/2018 01/27/18   Mancel Bale, MD    butalbital-acetaminophen-caffeine (FIORICET) [phone] MG tablet Take 1-2 tablets by mouth every 6 (six) hours as needed for headache. 09/09/19 09/08/20  Gailen Shelter, PA  cephALEXin (KEFLEX) 500 MG capsule Take 1 capsule (500 mg total) by mouth 4 (four) times daily. 05/28/19   Nodal, Amber K, PA-C  cyclobenzaprine (FLEXERIL) 5 MG tablet Take 1 tablet (5 mg total) by mouth 3 (three) times daily as needed. Patient not taking: Reported on 09/29/2018 09/07/18   Fayrene Helper, PA-C  dicyclomine (BENTYL) 20 MG tablet Take 1 tablet (20 mg total) by mouth 2 (two) times daily. 04/21/19   Palumbo, April, MD  methocarbamol (ROBAXIN) 500 MG tablet Take 1 tablet (500 mg total) by mouth at bedtime as needed for muscle spasms. 06/07/19   Caccavale, Sophia, PA-C  naproxen (NAPROSYN) 500 MG tablet Take 1 tablet (500 mg total) by mouth 2 (two) times daily as needed for mild pain or moderate pain. 09/03/19   Ward, Chase Picket, PA-C  omeprazole (PRILOSEC) 20 MG capsule Take 1 capsule (20 mg total) by mouth daily. 04/21/19   Palumbo, April, MD  predniSONE (DELTASONE) 20 MG tablet Take 2 tablets (40 mg total) by mouth daily. 12/20/18   Roxy Horseman, PA-C  salicylic acid 6 % gel Apply topically daily. Apply to wart Patient not taking: Reported on  09/29/2018 03/10/17   Lavera Guise, MD  traMADol (ULTRAM) 50 MG tablet Take 1 tablet (50 mg total) by mouth every 6 (six) hours as needed. Patient not taking: Reported on 09/29/2018 11/22/15   Arthor Captain, PA-C    Allergies    Other  Review of Systems   Review of Systems  Eyes: Negative for visual disturbance.  Skin: Positive for wound.  Neurological: Negative for dizziness and headaches.     Physical Exam Updated Vital Signs BP 136/79 (BP Location: Left Arm)   Pulse 98   Temp 99.4 F (37.4 C) (Oral)   Resp 18   SpO2 99%   Physical Exam Vitals and nursing note reviewed. Exam conducted with a chaperone present.  Constitutional:      Appearance: Normal  appearance.  HENT:     Head: Normocephalic and atraumatic.     Nose:     Comments: Nares patent bilaterally.  Patient able to inspire and expire through each nare independently.  Hemostasis achieved.  No obvious deformity.  Overlying nonbleeding abrasions.  Mild maxillary TTP bilaterally, but patient attributes to recent dental extraction.    Mouth/Throat:     Comments: Teeth #5-9 extracted.  Incision site well-healing.  Sutures still in place, undisturbed.  No bleeding.  Oropharynx patent.  No evidence of infection at this time. Eyes:     General: No scleral icterus.    Conjunctiva/sclera: Conjunctivae normal.  Cardiovascular:     Rate and Rhythm: Normal rate and regular rhythm.     Pulses: Normal pulses.     Heart sounds: Normal heart sounds.  Pulmonary:     Effort: Pulmonary effort is normal. No respiratory distress.     Breath sounds: Normal breath sounds.  Musculoskeletal:     Cervical back: Normal range of motion and neck supple. No rigidity or tenderness.  Skin:    General: Skin is dry.     Capillary Refill: Capillary refill takes less than 2 seconds.  Neurological:     General: No focal deficit present.     Mental Status: He is alert and oriented to person, place, and time.     GCS: GCS eye subscore is 4. GCS verbal subscore is 5. GCS motor subscore is 6.     Cranial Nerves: No cranial nerve deficit.     Sensory: No sensory deficit.     Gait: Gait normal.  Psychiatric:        Mood and Affect: Mood normal.        Behavior: Behavior normal.        Thought Content: Thought content normal.     ED Results / Procedures / Treatments   Labs (all labs ordered are listed, but only abnormal results are displayed) Labs Reviewed - No data to display  EKG None  Radiology DG Nasal Bones  Result Date: 12/30/2019 CLINICAL DATA:  Blunt trauma to nose. EXAM: NASAL BONES - 3+ VIEW COMPARISON:  None. FINDINGS: There is no evidence of fracture or other bone abnormality. IMPRESSION:  Negative. Electronically Signed   By: Elberta Fortis M.D.   On: 12/30/2019 16:05    Procedures Procedures (including critical care time)  Medications Ordered in ED Medications - No data to display  ED Course  I have reviewed the triage vital signs and the nursing notes.  Pertinent labs & imaging results that were available during my care of the patient were reviewed by me and considered in my medical decision making (see chart for details).  MDM Rules/Calculators/A&P                      DG nasal bones demonstrate no evidence of fracture or other bony abnormality.  There is no evidence of septal hematoma on my physical exam.  Patient is able to inspire and expire through each nare.  I had him occlude 1 and test the other repeatedly.  He reports that he has 800 mg ibuprofen at home from his recent dental procedure which he plans to take regularly for his discomfort and inflammation.  Hemostasis has been achieved and he even was able to blow his nose several times in the ER without rebleeding.  He is not on any blood thinners.  Patient is in no acute distress and is relieved by the reassuring x-ray.  Patient is safe for discharge at this time.  Strict return precautions discussed with the patient and he voiced understanding.  All of the evaluation and work-up results were discussed with the patient and any family at bedside. They were provided opportunity to ask any additional questions and have none at this time. They have expressed understanding of verbal discharge instructions as well as return precautions and are agreeable to the plan.    Final Clinical Impression(s) / ED Diagnoses Final diagnoses:  Epistaxis    Rx / DC Orders ED Discharge Orders    None       Corena Herter, PA-C 12/30/19 1634    Blanchie Dessert, MD 01/03/20 1254

## 2019-12-30 NOTE — ED Notes (Signed)
Patient given discharge instructions patient verbalizes understanding. 

## 2020-06-20 IMAGING — CR DG SHOULDER 2+V*L*
3 series · 3 of 3 positions shown · non-contrast
Comparison: 12/21/2011

CLINICAL DATA: Pain in left shoulder for the past 5 days, no known
recent injury. Pt states he had many football injuries to his
shoulder in the past. He has also dislocated his left shoulder
several times.

EXAM:
LEFT SHOULDER - 2+ VIEW

[shoulder grashey]
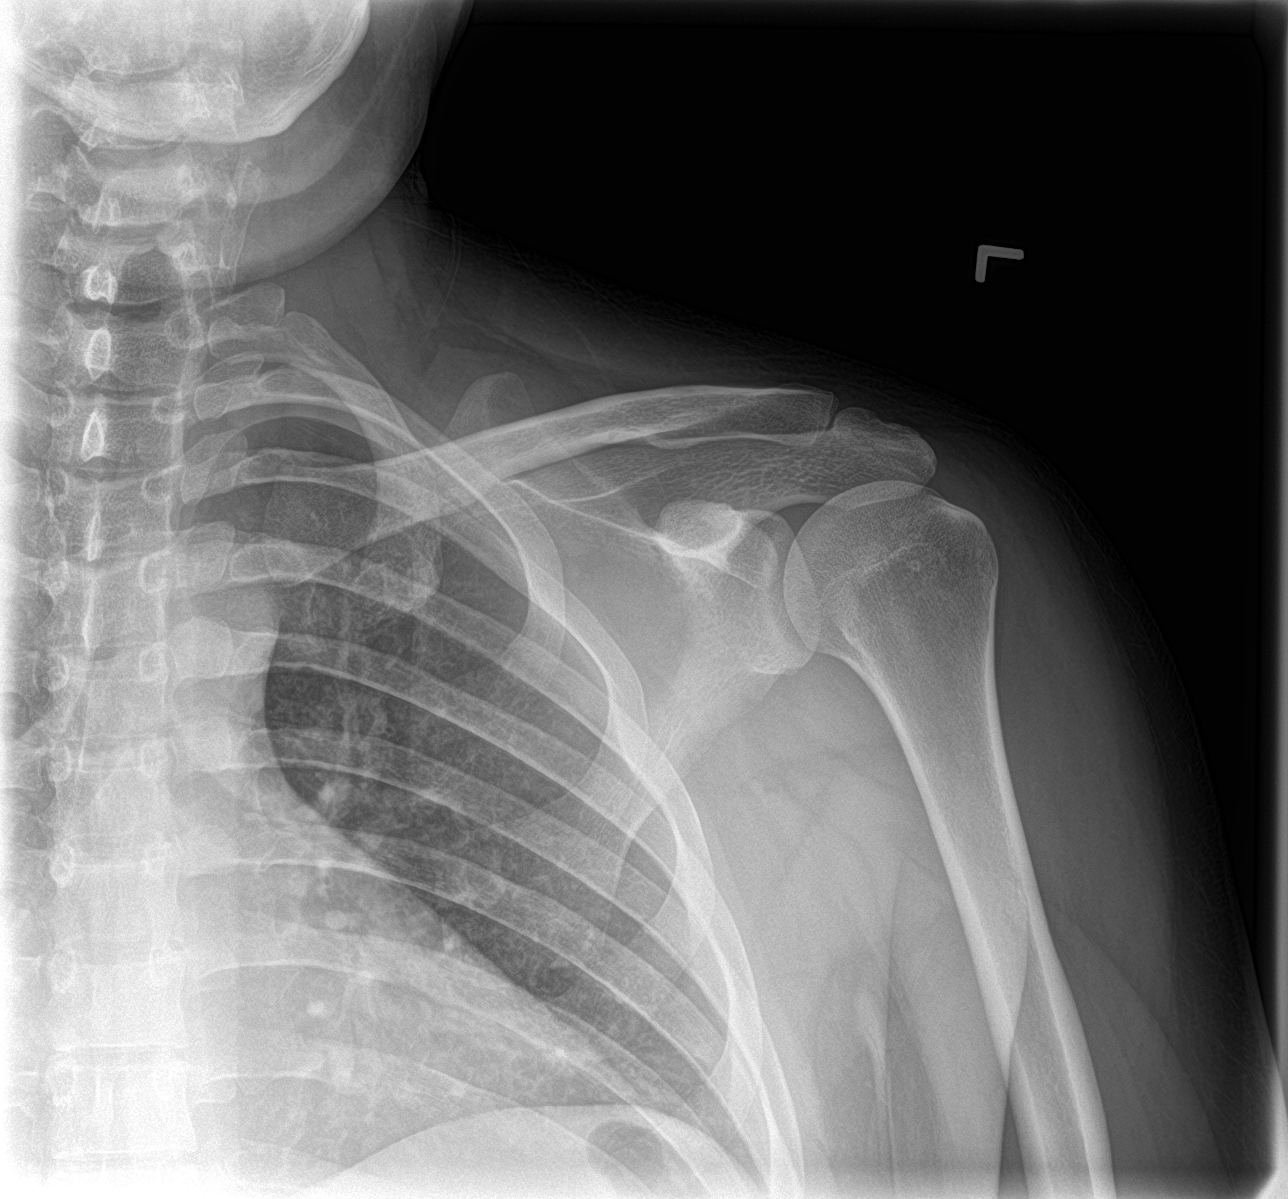

[shoulder y view]
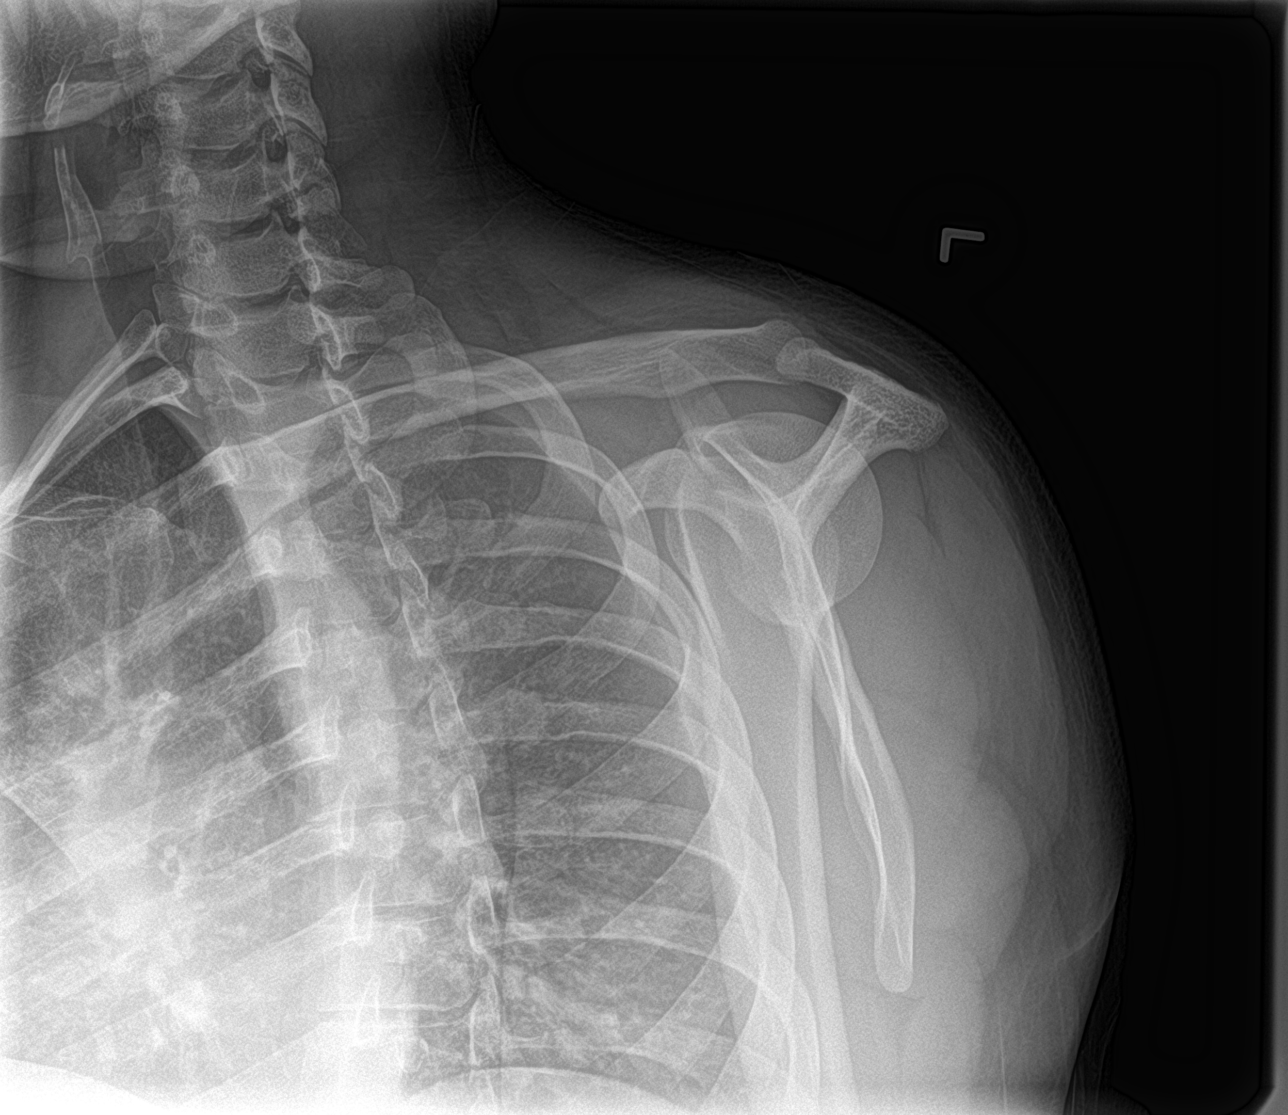

[shoulder axillary]
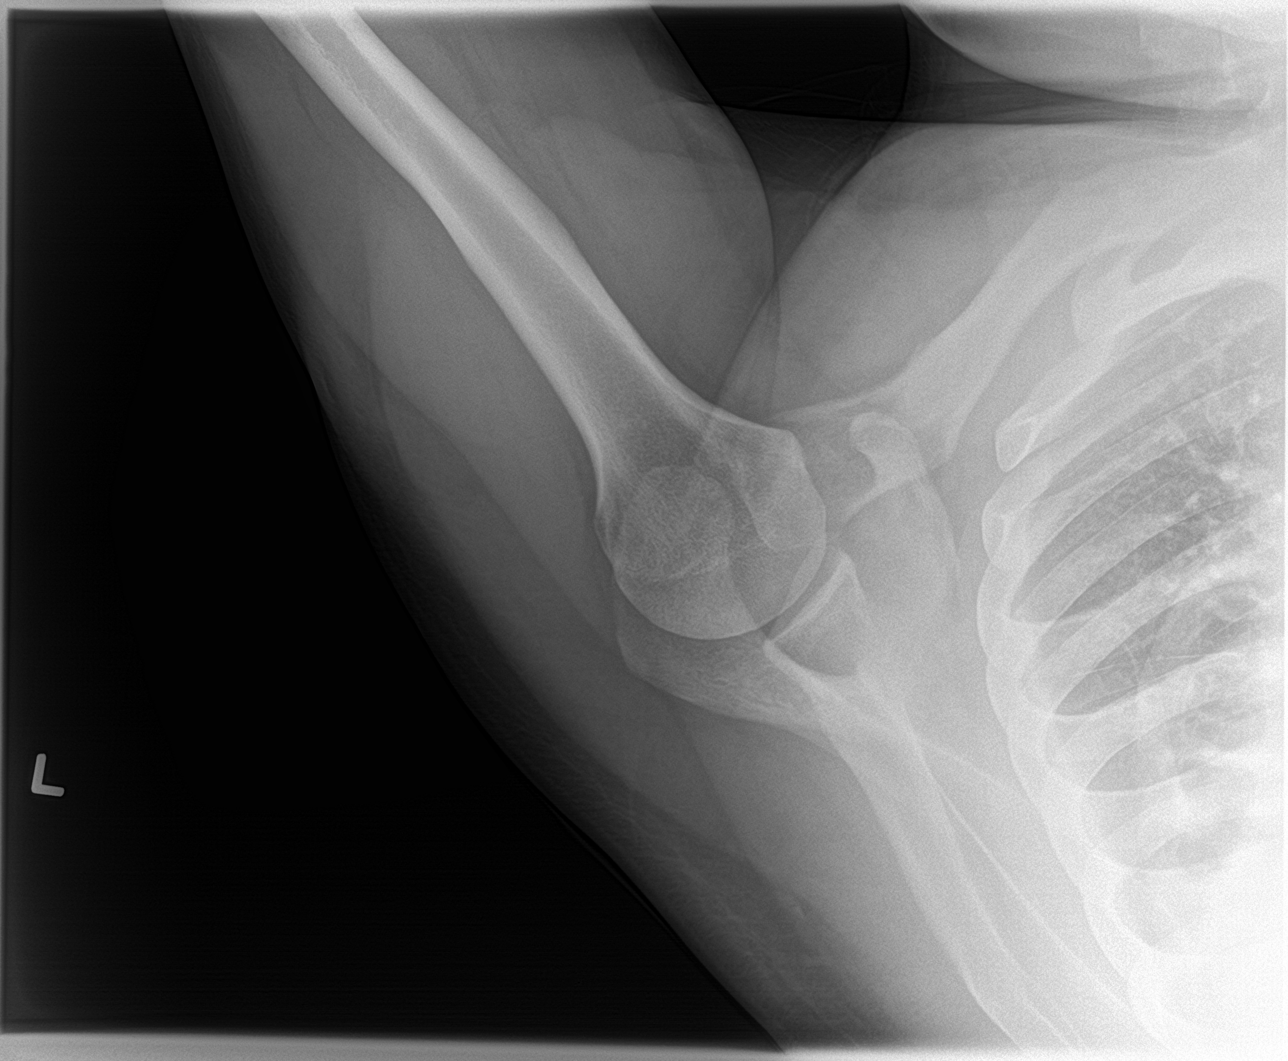

[3 of 3 positions shown; findings below may reference images not displayed]

FINDINGS: There is no evidence of fracture or dislocation. There is no
evidence of arthropathy or other focal bone abnormality. Soft
tissues are unremarkable.
IMPRESSION: Negative.

## 2021-05-31 ENCOUNTER — Other Ambulatory Visit: Payer: Self-pay

## 2021-05-31 ENCOUNTER — Encounter (HOSPITAL_COMMUNITY): Payer: Self-pay | Admitting: Pharmacy Technician

## 2021-05-31 ENCOUNTER — Emergency Department (HOSPITAL_COMMUNITY)
Admission: EM | Admit: 2021-05-31 | Discharge: 2021-05-31 | Disposition: A | Discharge status: Home / Self Care | Payer: Medicaid Other | Attending: Emergency Medicine | Admitting: Emergency Medicine

## 2021-05-31 DIAGNOSIS — F17210 Nicotine dependence, cigarettes, uncomplicated: Secondary | ICD-10-CM | POA: Insufficient documentation

## 2021-05-31 DIAGNOSIS — B86 Scabies: Secondary | ICD-10-CM | POA: Insufficient documentation

## 2021-05-31 MED ORDER — PERMETHRIN 5 % EX CREA: TOPICAL_CREAM | CUTANEOUS | 0 refills | Status: DC

## 2021-05-31 NOTE — ED Notes (Addendum)
Patient Alert and oriented to baseline. Stable and ambulatory to baseline. Patient verbalized understanding of the discharge instructions.  Patient belongings were taken by the patient.   

## 2021-05-31 NOTE — ED Triage Notes (Signed)
Pt here with reports of rash to arms and head and neck onset approx 1 month ago. States his kids have scabies.

## 2021-05-31 NOTE — ED Provider Notes (Signed)
[facility] EMERGENCY DEPARTMENT Provider Note   CSN: [id number] Arrival date & time: 05/31/21  1120     History No chief complaint on file.   Arthur Mcdonald is a 30 y.o. male.  HPI  Patient presents with a rash on his arm and in between his fingers and on the back of his neck.  Started about 3 weeks ago.  Patient reports his ex-wife and son had just been diagnosed with scabies and the rash looks similar.  Past Medical History:  Diagnosis Date   ADHD (attention deficit hyperactivity disorder)    Anxiety    Bipolar disorder (HCC)    Skin infection     There are no problems to display for this patient.   Past Surgical History:  Procedure Laterality Date   Birthmark removed from right side of head         Family History  Problem Relation Age of Onset   Diabetes Other    Heart attack Other    Diabetes Mother     Social History   Tobacco Use   Smoking status: Every Day    Packs/day: 1.00    Pack years: 0.00    Types: Cigarettes   Smokeless tobacco: Never  Substance Use Topics   Alcohol use: Yes   Drug use: No    Home Medications Prior to Admission medications   Medication Sig Start Date End Date Taking? Authorizing Provider  amoxicillin (AMOXIL) 500 MG capsule Take 1 capsule (500 mg total) by mouth 3 (three) times daily. 12/20/18   Roxy Horseman, PA-C  azithromycin (ZITHROMAX Z-PAK) 250 MG tablet 2 po day one, then 1 daily x 4 days Patient not taking: Reported on 09/29/2018 01/27/18   Mancel Bale, MD  cephALEXin (KEFLEX) 500 MG capsule Take 1 capsule (500 mg total) by mouth 4 (four) times daily. 05/28/19   Nodal, Amber K, PA-C  cyclobenzaprine (FLEXERIL) 5 MG tablet Take 1 tablet (5 mg total) by mouth 3 (three) times daily as needed. Patient not taking: Reported on 09/29/2018 09/07/18   Fayrene Helper, PA-C  dicyclomine (BENTYL) 20 MG tablet Take 1 tablet (20 mg total) by mouth 2 (two) times daily. 04/21/19   Palumbo, April, MD   methocarbamol (ROBAXIN) 500 MG tablet Take 1 tablet (500 mg total) by mouth at bedtime as needed for muscle spasms. 06/07/19   Caccavale, Sophia, PA-C  naproxen (NAPROSYN) 500 MG tablet Take 1 tablet (500 mg total) by mouth 2 (two) times daily as needed for mild pain or moderate pain. 09/03/19   Ward, Chase Picket, PA-C  omeprazole (PRILOSEC) 20 MG capsule Take 1 capsule (20 mg total) by mouth daily. 04/21/19   Palumbo, April, MD  predniSONE (DELTASONE) 20 MG tablet Take 2 tablets (40 mg total) by mouth daily. 12/20/18   Roxy Horseman, PA-C  salicylic acid 6 % gel Apply topically daily. Apply to wart Patient not taking: Reported on 09/29/2018 03/10/17   Lavera Guise, MD  traMADol (ULTRAM) 50 MG tablet Take 1 tablet (50 mg total) by mouth every 6 (six) hours as needed. Patient not taking: Reported on 09/29/2018 11/22/15   Arthor Captain, PA-C    Allergies    Other  Review of Systems   Review of Systems  Constitutional:  Negative for fever.  Skin:  Positive for rash.   Physical Exam Updated Vital Signs BP 132/74   Pulse 100   Temp 98.6 F (37 C) (Oral)   Resp 12   SpO2 98%  Physical Exam Vitals and nursing note reviewed. Exam conducted with a chaperone present.  Constitutional:      General: He is not in acute distress.    Appearance: Normal appearance.  HENT:     Head: Normocephalic and atraumatic.  Eyes:     General: No scleral icterus.    Extraocular Movements: Extraocular movements intact.     Pupils: Pupils are equal, round, and reactive to light.  Skin:    Coloration: Skin is not jaundiced.     Comments: Linear lesions between the finger webbings. Moves upward on both arms.   Neurological:     Mental Status: He is alert. Mental status is at baseline.     Coordination: Coordination normal.    ED Results / Procedures / Treatments   Labs (all labs ordered are listed, but only abnormal results are displayed) Labs Reviewed - No data to  display  EKG None  Radiology No results found.  Procedures Procedures   Medications Ordered in ED Medications - No data to display  ED Course  I have reviewed the triage vital signs and the nursing notes.  Pertinent labs & imaging results that were available during my care of the patient were reviewed by me and considered in my medical decision making (see chart for details).    MDM Rules/Calculators/A&P                          Patient presents with rash x3 weeks.  Rash is scabies.  His vitals are stable, he is not febrile.  Will discharge patient with permethrin cream and instructions.  Patient is agreeable to plan.  Final Clinical Impression(s) / ED Diagnoses Final diagnoses:  None    Rx / DC Orders ED Discharge Orders     None        Theron Arista, Cordelia Poche 05/31/21 1911    Terald Sleeper, MD 06/01/21 [id number]

## 2021-05-31 NOTE — Discharge Instructions (Addendum)
You were diagnosed with scabies here in the ER.  Please apply the permethrin cream all over the affected areas of sleep with that, leave it on for about 12 hours.  Reapply 2 weeks later. Wash all the sheets and clothes that have been exposed.

## 2021-07-02 ENCOUNTER — Encounter (HOSPITAL_COMMUNITY): Payer: Self-pay

## 2021-07-02 ENCOUNTER — Other Ambulatory Visit: Payer: Self-pay

## 2021-07-02 ENCOUNTER — Emergency Department (HOSPITAL_COMMUNITY): Payer: Self-pay

## 2021-07-02 ENCOUNTER — Emergency Department (HOSPITAL_COMMUNITY)
Admission: EM | Admit: 2021-07-02 | Discharge: 2021-07-02 | Disposition: A | Discharge status: Home / Self Care | Payer: Self-pay | Attending: Emergency Medicine | Admitting: Emergency Medicine

## 2021-07-02 ENCOUNTER — Emergency Department (HOSPITAL_COMMUNITY)
Admission: EM | Admit: 2021-07-02 | Discharge: 2021-07-02 | Disposition: A | Discharge status: Home / Self Care | Payer: Self-pay | Attending: Student | Admitting: Student

## 2021-07-02 DIAGNOSIS — S61211A Laceration without foreign body of left index finger without damage to nail, initial encounter: Secondary | ICD-10-CM | POA: Insufficient documentation

## 2021-07-02 DIAGNOSIS — F17210 Nicotine dependence, cigarettes, uncomplicated: Secondary | ICD-10-CM | POA: Insufficient documentation

## 2021-07-02 DIAGNOSIS — Y9389 Activity, other specified: Secondary | ICD-10-CM | POA: Insufficient documentation

## 2021-07-02 DIAGNOSIS — Y9289 Other specified places as the place of occurrence of the external cause: Secondary | ICD-10-CM | POA: Insufficient documentation

## 2021-07-02 DIAGNOSIS — Y990 Civilian activity done for income or pay: Secondary | ICD-10-CM | POA: Insufficient documentation

## 2021-07-02 DIAGNOSIS — Z5321 Procedure and treatment not carried out due to patient leaving prior to being seen by health care provider: Secondary | ICD-10-CM | POA: Insufficient documentation

## 2021-07-02 DIAGNOSIS — W268XXA Contact with other sharp object(s), not elsewhere classified, initial encounter: Secondary | ICD-10-CM | POA: Insufficient documentation

## 2021-07-02 DIAGNOSIS — Z23 Encounter for immunization: Secondary | ICD-10-CM | POA: Insufficient documentation

## 2021-07-02 HISTORY — DX: Depression, unspecified: F32.A

## 2021-07-02 MED ORDER — BACITRACIN ZINC 500 UNIT/GM EX OINT
TOPICAL_OINTMENT | Freq: Two times a day (BID) | CUTANEOUS | Status: DC
  Filled 2021-07-02: qty 0.9

## 2021-07-02 MED ORDER — TETANUS-DIPHTH-ACELL PERTUSSIS 5-2.5-18.5 LF-MCG/0.5 IM SUSY
0.5000 mL | PREFILLED_SYRINGE | Freq: Once | INTRAMUSCULAR | Status: AC
  Administered 2021-07-02: 0.5 mL via INTRAMUSCULAR
  Filled 2021-07-02: qty 0.5

## 2021-07-02 MED ORDER — LIDOCAINE HCL (PF) 1 % IJ SOLN
30.0000 mL | Freq: Once | INTRAMUSCULAR | Status: AC
  Administered 2021-07-02: 30 mL
  Filled 2021-07-02: qty 30

## 2021-07-02 NOTE — Discharge Instructions (Addendum)
Your wound was repaired using  1 suture.  These will need to be removed in 10 to 14 days.  You may present to your primary care doctor, urgent care, or the emergency department for suture removal.  Please return sooner if you develop any redness, swelling, drainage of puslike fluid from the wound, or any fevers or chills as these could be signs infection.  Also return if develop any new numbness, ting, weakness in the hand, or any other new severe symptoms.

## 2021-07-02 NOTE — ED Triage Notes (Signed)
Pt here from home with a laceration on the left index finger. Bleeding is controlled but thinks he needs stitches and has decreased sensation. Needs tetanus shot

## 2021-07-02 NOTE — ED Triage Notes (Signed)
Pt presents with left index finger laceration from a razor while working. Unknown last tetanus. Bleeding controlled at this time.

## 2021-07-02 NOTE — ED Provider Notes (Signed)
Emergency Medicine Provider Triage Evaluation Note  Arthur Mcdonald , a 30 y.o. male  was evaluated in triage.  Pt complains of laceration to his left index finger after using a razor while working.  Unknown last tetanus.  Patient states that he feels as if the razor went into his bone.  Does admit to some numbness and tingling distally.  Review of Systems  Positive: Finger laceration Negative:   Physical Exam  BP (!) 129/91 (BP Location: Left Arm)   Pulse 71   Temp 98.4 F (36.9 C) (Oral)   Resp 18   SpO2 98%  Gen:   Awake, no distress   Resp:  Normal effort  MSK:   Left finger with laceration about 2 cm line, when bandages are removed bleeding begins, held pressure.  Radial pulse 2+.  Able to move joints normally. Good cap refill  Medical Decision Making  Medically screening exam initiated at 5:28 PM.  Appropriate orders placed.  Arthur Mcdonald was informed that the remainder of the evaluation will be completed by another provider, this initial triage assessment does not replace that evaluation, and the importance of remaining in the ED until their evaluation is complete.     Farrel Gordon, PA-C 07/02/21 1730    Virgina Norfolk, DO 07/02/21 2323

## 2021-07-02 NOTE — ED Provider Notes (Signed)
[facility] EMERGENCY DEPARTMENT Provider Note   CSN: [id number] Arrival date & time: 07/02/21  2057     History Chief Complaint  Patient presents with   Laceration    Arthur Mcdonald is a 30 y.o. male with laceration to the lateral finger sustained around 330 this afternoon.  Injured his finger with a razor blade while at work.  States it was a clean very sharp blade and there was no debris to enter the wound.  States it bled quite a bit initially.  He is not up-to-date on his tetanus vaccine.   Denies any numbness, tingling or weakness in his hand.  HPI     Past Medical History:  Diagnosis Date   ADHD (attention deficit hyperactivity disorder)    Anxiety    Bipolar disorder (HCC)    Depression    Skin infection     There are no problems to display for this patient.   Past Surgical History:  Procedure Laterality Date   Birthmark removed from right side of head         Family History  Problem Relation Age of Onset   Diabetes Other    Heart attack Other    Diabetes Mother     Social History   Tobacco Use   Smoking status: Every Day    Packs/day: 1.00    Types: Cigarettes   Smokeless tobacco: Never  Substance Use Topics   Alcohol use: Yes   Drug use: No    Home Medications Prior to Admission medications   Medication Sig Start Date End Date Taking? Authorizing Provider  amoxicillin (AMOXIL) 500 MG capsule Take 1 capsule (500 mg total) by mouth 3 (three) times daily. 12/20/18   Roxy Horseman, PA-C  azithromycin (ZITHROMAX Z-PAK) 250 MG tablet 2 po day one, then 1 daily x 4 days Patient not taking: Reported on 09/29/2018 01/27/18   Mancel Bale, MD  cephALEXin (KEFLEX) 500 MG capsule Take 1 capsule (500 mg total) by mouth 4 (four) times daily. 05/28/19   Nodal, Amber K, PA-C  cyclobenzaprine (FLEXERIL) 5 MG tablet Take 1 tablet (5 mg total) by mouth 3 (three) times daily as needed. Patient not taking: Reported on 09/29/2018 09/07/18   Fayrene Helper, PA-C   dicyclomine (BENTYL) 20 MG tablet Take 1 tablet (20 mg total) by mouth 2 (two) times daily. 04/21/19   Palumbo, April, MD  methocarbamol (ROBAXIN) 500 MG tablet Take 1 tablet (500 mg total) by mouth at bedtime as needed for muscle spasms. 06/07/19   Caccavale, Sophia, PA-C  naproxen (NAPROSYN) 500 MG tablet Take 1 tablet (500 mg total) by mouth 2 (two) times daily as needed for mild pain or moderate pain. 09/03/19   Ward, Chase Picket, PA-C  omeprazole (PRILOSEC) 20 MG capsule Take 1 capsule (20 mg total) by mouth daily. 04/21/19   Palumbo, April, MD  permethrin (ELIMITE) 5 % cream Apply to affected area once 05/31/21   Theron Arista, PA-C  predniSONE (DELTASONE) 20 MG tablet Take 2 tablets (40 mg total) by mouth daily. 12/20/18   Roxy Horseman, PA-C  salicylic acid 6 % gel Apply topically daily. Apply to wart Patient not taking: Reported on 09/29/2018 03/10/17   Lavera Guise, MD  traMADol (ULTRAM) 50 MG tablet Take 1 tablet (50 mg total) by mouth every 6 (six) hours as needed. Patient not taking: Reported on 09/29/2018 11/22/15   Arthor Captain, PA-C    Allergies    Other  Review of Systems  Review of Systems  Constitutional: Negative.   HENT: Negative.    Respiratory: Negative.    Cardiovascular: Negative.   Gastrointestinal: Negative.   Skin:  Positive for wound.  Allergic/Immunologic: Negative for immunocompromised state.   Physical Exam Updated Vital Signs BP 138/86 (BP Location: Right Arm)   Pulse 84   Temp 98.1 F (36.7 C) (Oral)   Resp 17   Ht 5\' 7"  (1.702 m)   Wt 99.8 kg   SpO2 99%   BMI 34.46 kg/m   Physical Exam Vitals and nursing note reviewed.  HENT:     Head: Normocephalic and atraumatic.  Eyes:     General: No scleral icterus.       Right eye: No discharge.        Left eye: No discharge.     Conjunctiva/sclera: Conjunctivae normal.  Pulmonary:     Effort: Pulmonary effort is normal.  Musculoskeletal:     Right hand: Normal.     Left hand: Laceration  present. There is no disruption of two-point discrimination. Normal capillary refill. Normal pulse.       Hands:  Skin:    General: Skin is warm and dry.     Capillary Refill: Capillary refill takes less than 2 seconds.  Neurological:     General: No focal deficit present.     Mental Status: He is alert.  Psychiatric:        Mood and Affect: Mood normal.    ED Results / Procedures / Treatments   Labs (all labs ordered are listed, but only abnormal results are displayed) Labs Reviewed - No data to display  EKG None  Radiology DG Finger Index Left  Result Date: 07/02/2021 CLINICAL DATA:  30 year old male with laceration of the left index finger pain EXAM: LEFT INDEX FINGER 2+V COMPARISON:  None. FINDINGS: There is no acute fracture or dislocation. Evaluation however is limited due to overlying bandage. Ill-defined density adjacent to the volar aspect of the PIP joint, likely related to bandage. The soft tissues are grossly unremarkable. IMPRESSION: No acute fracture or dislocation. Electronically Signed   By: 27 M.D.   On: 07/02/2021 19:06    Procedures .07/27/2022Laceration Repair  Date/Time: 07/02/2021 11:15 PM Performed by: 07/04/2021, PA-C Authorized by: Paris Lore, PA-C   Consent:    Consent obtained:  Verbal   Consent given by:  Patient   Risks discussed:  Infection, need for additional repair, pain, poor cosmetic result and poor wound healing   Alternatives discussed:  No treatment and delayed treatment Universal protocol:    Procedure explained and questions answered to patient or proxy's satisfaction: yes     Relevant documents present and verified: yes     Test results available: yes     Imaging studies available: yes     Required blood products, implants, devices, and special equipment available: yes     Site/side marked: yes     Immediately prior to procedure, a time out was called: yes     Patient identity confirmed:  Verbally with  patient Anesthesia:    Anesthesia method:  Nerve block   Block location:  Left index finger   Block needle gauge:  25 G   Block anesthetic:  Lidocaine 1% w/o epi   Block technique:  Digital   Block injection procedure:  Anatomic landmarks identified, introduced needle and negative aspiration for blood   Block outcome:  Anesthesia achieved Laceration details:    Location:  Finger  Finger location:  L index finger   Length (cm):  1.5 Pre-procedure details:    Preparation:  Imaging obtained to evaluate for foreign bodies Exploration:    Hemostasis achieved with:  Direct pressure   Imaging obtained: x-ray     Imaging outcome: foreign body not noted     Wound extent: no foreign bodies/material noted, no tendon damage noted and no underlying fracture noted     Contaminated: no   Treatment:    Area cleansed with:  Betadine and saline   Amount of cleaning:  Extensive   Irrigation solution:  Sterile saline Skin repair:    Repair method:  Sutures   Suture size:  5-0   Suture material:  Prolene   Suture technique:  Simple interrupted   Number of sutures:  1 Approximation:    Approximation:  Close Repair type:    Repair type:  Simple Post-procedure details:    Dressing:  Antibiotic ointment and non-adherent dressing   Procedure completion:  Tolerated well, no immediate complications   Medications Ordered in ED Medications  lidocaine (PF) (XYLOCAINE) 1 % injection 30 mL (30 mLs Other Given 07/02/21 2254)  Tdap (BOOSTRIX) injection 0.5 mL (0.5 mLs Intramuscular Given 07/02/21 2254)    ED Course  I have reviewed the triage vital signs and the nursing notes.  Pertinent labs & imaging results that were available during my care of the patient were reviewed by me and considered in my medical decision making (see chart for details).    MDM Rules/Calculators/A&P                         30 year old male presents with concern for laceration to left index finger.  Vital signs are normal  intake.  Physical exam revealed 1.5 cm laceration to the lateral left index finger, hemostatic at this time.  Plain film obtained in triage without foreign body or underlying fracture or dislocation.  Boostrix updated, wound repaired as above.  No further work-up warranted in ED at this time.  Terald voiced understanding with medical evaluation treatment plan.  Each of his questions answered to his expressed inspection.  Return precautions given.  Patient is well-appearing and stable and appropriate for discharge at this time.  This chart was dictated using voice recognition software, Dragon. Despite the best efforts of this provider to proofread and correct errors, errors may still occur which can change documentation meaning.  This chart was dictated using voice recognition software, Dragon. Despite the best efforts of this provider to proofread and correct errors, errors may still occur which can change documentation meaning.  Final Clinical Impression(s) / ED Diagnoses Final diagnoses:  None    Rx / DC Orders ED Discharge Orders     None        Sherrilee Gilles 07/02/21 2316    Terrilee Files, MD 07/03/21 1625

## 2021-07-05 ENCOUNTER — Emergency Department (HOSPITAL_COMMUNITY)
Admission: EM | Admit: 2021-07-05 | Discharge: 2021-07-06 | Disposition: A | Discharge status: Home / Self Care | Payer: Self-pay | Attending: Emergency Medicine | Admitting: Emergency Medicine

## 2021-07-05 ENCOUNTER — Other Ambulatory Visit: Payer: Self-pay

## 2021-07-05 ENCOUNTER — Emergency Department (HOSPITAL_COMMUNITY): Payer: Self-pay

## 2021-07-05 DIAGNOSIS — F17210 Nicotine dependence, cigarettes, uncomplicated: Secondary | ICD-10-CM | POA: Insufficient documentation

## 2021-07-05 DIAGNOSIS — J069 Acute upper respiratory infection, unspecified: Secondary | ICD-10-CM | POA: Insufficient documentation

## 2021-07-05 DIAGNOSIS — U071 COVID-19: Secondary | ICD-10-CM | POA: Insufficient documentation

## 2021-07-05 DIAGNOSIS — R072 Precordial pain: Secondary | ICD-10-CM

## 2021-07-05 LAB — TROPONIN I (HIGH SENSITIVITY)
Troponin I (High Sensitivity): <2 ng/L (ref <18)
Troponin I (High Sensitivity): <2 ng/L (ref <18)

## 2021-07-05 LAB — BASIC METABOLIC PANEL
Anion gap: 6 (ref 5–15)
BUN: 12 mg/dL (ref 6–20)
CO2: 26 mmol/L (ref 22–32)
Calcium: 8.7 mg/dL — ABNORMAL LOW (ref 8.9–10.3)
Chloride: 107 mmol/L (ref 98–111)
Creatinine, Ser: 1.12 mg/dL (ref 0.61–1.24)
GFR, Estimated: >60 mL/min (ref >60)
Glucose, Bld: 88 mg/dL (ref 70–99)
Potassium: 4.1 mmol/L (ref 3.5–5.1)
Sodium: 139 mmol/L (ref 135–145)

## 2021-07-05 LAB — CBC
HCT: 49 % (ref 39.0–52.0)
Hemoglobin: 16.5 g/dL (ref 13.0–17.0)
MCH: 31.4 pg (ref 26.0–34.0)
MCHC: 33.7 g/dL (ref 30.0–36.0)
MCV: 93.3 fL (ref 80.0–100.0)
Platelets: 205 10*3/uL (ref 150–400)
RBC: 5.25 MIL/uL (ref 4.22–5.81)
RDW: 13.2 % (ref 11.5–15.5)
WBC: 7.4 10*3/uL (ref 4.0–10.5)
nRBC: 0 % (ref 0.0–0.2)

## 2021-07-05 NOTE — ED Triage Notes (Signed)
Chest pains, congestions, abdominal pain for a few days. Worse today.

## 2021-07-06 LAB — RESP PANEL BY RT-PCR (FLU A&B, COVID) ARPGX2
Influenza A by PCR: NEGATIVE
Influenza B by PCR: NEGATIVE
SARS Coronavirus 2 by RT PCR: POSITIVE — AB

## 2021-07-06 MED ORDER — IBUPROFEN 400 MG PO TABS
400.0000 mg | ORAL_TABLET | Freq: Once | ORAL | Status: AC
  Administered 2021-07-06: 400 mg via ORAL
  Filled 2021-07-06: qty 1

## 2021-07-06 NOTE — ED Provider Notes (Signed)
[facility] EMERGENCY DEPARTMENT Provider Note   CSN: [id number] Arrival date & time: 07/05/21  1936     History Chief Complaint  Patient presents with   Chest Pain    Arthur Mcdonald is a 30 y.o. male.  The history is provided by the patient.  Chest Pain Pain location:  Substernal area Pain quality: pressure   Pain radiates to:  Does not radiate Pain severity:  Mild Onset quality:  Gradual Timing:  Constant Progression:  Unchanged Chronicity:  New Relieved by:  Nothing Worsened by:  Coughing Associated symptoms: abdominal pain, cough and fatigue   Patient reports symptoms started as chest pain over a day ago.  They began having cough, congestion.  He also reports abdominal pain and diarrhea.  He is a smoker.  He is vaccinated for COVID.  He also reports headache     Past Medical History:  Diagnosis Date   ADHD (attention deficit hyperactivity disorder)    Anxiety    Bipolar disorder (HCC)    Depression    Skin infection     There are no problems to display for this patient.   Past Surgical History:  Procedure Laterality Date   Birthmark removed from right side of head         Family History  Problem Relation Age of Onset   Diabetes Other    Heart attack Other    Diabetes Mother     Social History   Tobacco Use   Smoking status: Every Day    Packs/day: 1.00    Types: Cigarettes   Smokeless tobacco: Never  Substance Use Topics   Alcohol use: Yes   Drug use: No    Home Medications Prior to Admission medications   Medication Sig Start Date End Date Taking? Authorizing Provider  dicyclomine (BENTYL) 20 MG tablet Take 1 tablet (20 mg total) by mouth 2 (two) times daily. 04/21/19   Palumbo, April, MD  naproxen (NAPROSYN) 500 MG tablet Take 1 tablet (500 mg total) by mouth 2 (two) times daily as needed for mild pain or moderate pain. 09/03/19   Ward, Chase Picket, PA-C  omeprazole (PRILOSEC) 20 MG capsule Take 1 capsule (20 mg total) by mouth daily.  04/21/19   Palumbo, April, MD  salicylic acid 6 % gel Apply topically daily. Apply to wart Patient not taking: Reported on 09/29/2018 03/10/17   Lavera Guise, MD    Allergies    Other  Review of Systems   Review of Systems  Constitutional:  Positive for chills and fatigue.  Respiratory:  Positive for cough.   Cardiovascular:  Positive for chest pain.  Gastrointestinal:  Positive for abdominal pain and diarrhea.  All other systems reviewed and are negative.  Physical Exam Updated Vital Signs BP 138/79   Pulse 82   Temp 99.4 F (37.4 C) (Oral)   Resp 20   Ht 1.702 m (5\' 7" )   Wt 99.8 kg   SpO2 98%   BMI 34.46 kg/m   Physical Exam CONSTITUTIONAL: Well developed/well nourished, no distress HEAD: Normocephalic/atraumatic EYES: EOMI/PERRL ENMT: Mucous membranes moist NECK: supple no meningeal signs SPINE/BACK:entire spine nontender CV: S1/S2 noted, no murmurs/rubs/gallops noted LUNGS: Lungs are clear to auscultation bilaterally, no apparent distress ABDOMEN: soft, nontender, no rebound or guarding, bowel sounds noted throughout abdomen GU:no cva tenderness NEURO: Pt is awake/alert/appropriate, moves all extremitiesx4.  No facial droop.  Moves all extremities without difficulty EXTREMITIES: pulses normal/equal, full ROM, no calf tenderness or edema SKIN: warm,  color normal PSYCH: no abnormalities of mood noted, alert and oriented to situation  ED Results / Procedures / Treatments   Labs (all labs ordered are listed, but only abnormal results are displayed) Labs Reviewed  BASIC METABOLIC PANEL - Abnormal; Notable for the following components:      Result Value   Calcium 8.7 (*)    All other components within normal limits  RESP PANEL BY RT-PCR (FLU A&B, COVID) ARPGX2  CBC  TROPONIN I (HIGH SENSITIVITY)  TROPONIN I (HIGH SENSITIVITY)    EKG EKG Interpretation  Date/Time:  Thursday July 05 2021 20:09:48 EDT Ventricular Rate:  86 PR Interval:  136 QRS  Duration: 78 QT Interval:  328 QTC Calculation: 392 R Axis:   77 Text Interpretation: Normal sinus rhythm Normal ECG Confirmed by Zadie Rhine ([id number]) on 07/05/2021 11:52:23 PM  Radiology DG Chest 2 View  Result Date: 07/05/2021 CLINICAL DATA:  30 year old male with chest pain EXAM: CHEST - 2 VIEW COMPARISON:  Chest radiograph dated 01/27/2018 FINDINGS: The no focal consolidation, pleural effusion or pneumothorax. The cardiac silhouette is within limits. No acute osseous pathology. IMPRESSION: No active cardiopulmonary disease. Electronically Signed   By: Elgie Collard M.D.   On: 07/05/2021 20:34    Procedures Procedures   Medications Ordered in ED Medications  ibuprofen (ADVIL) tablet 400 mg (400 mg Oral Given 07/06/21 0007)    ED Course  I have reviewed the triage vital signs and the nursing notes.  Pertinent labs & imaging results that were available during my care of the patient were reviewed by me and considered in my medical decision making (see chart for details).    MDM Rules/Calculators/A&P                           Patient with viral type illness which includes cough, congestion, headache and diarrhea.  Will test for COVID-19.  Work-up thus far is unremarkable. Discharge home Final Clinical Impression(s) / ED Diagnoses Final diagnoses:  Precordial pain  Viral URI    Rx / DC Orders ED Discharge Orders     None        Zadie Rhine, MD 07/06/21 0045

## 2021-07-06 NOTE — Discharge Instructions (Addendum)

## 2023-04-18 IMAGING — DX DG CHEST 2V
2 series · 2 of 2 positions shown · non-contrast
Comparison: Chest radiograph dated 01/27/2018

CLINICAL DATA: 29-year-old male with chest pain

EXAM:
CHEST - 2 VIEW

[chest pa]
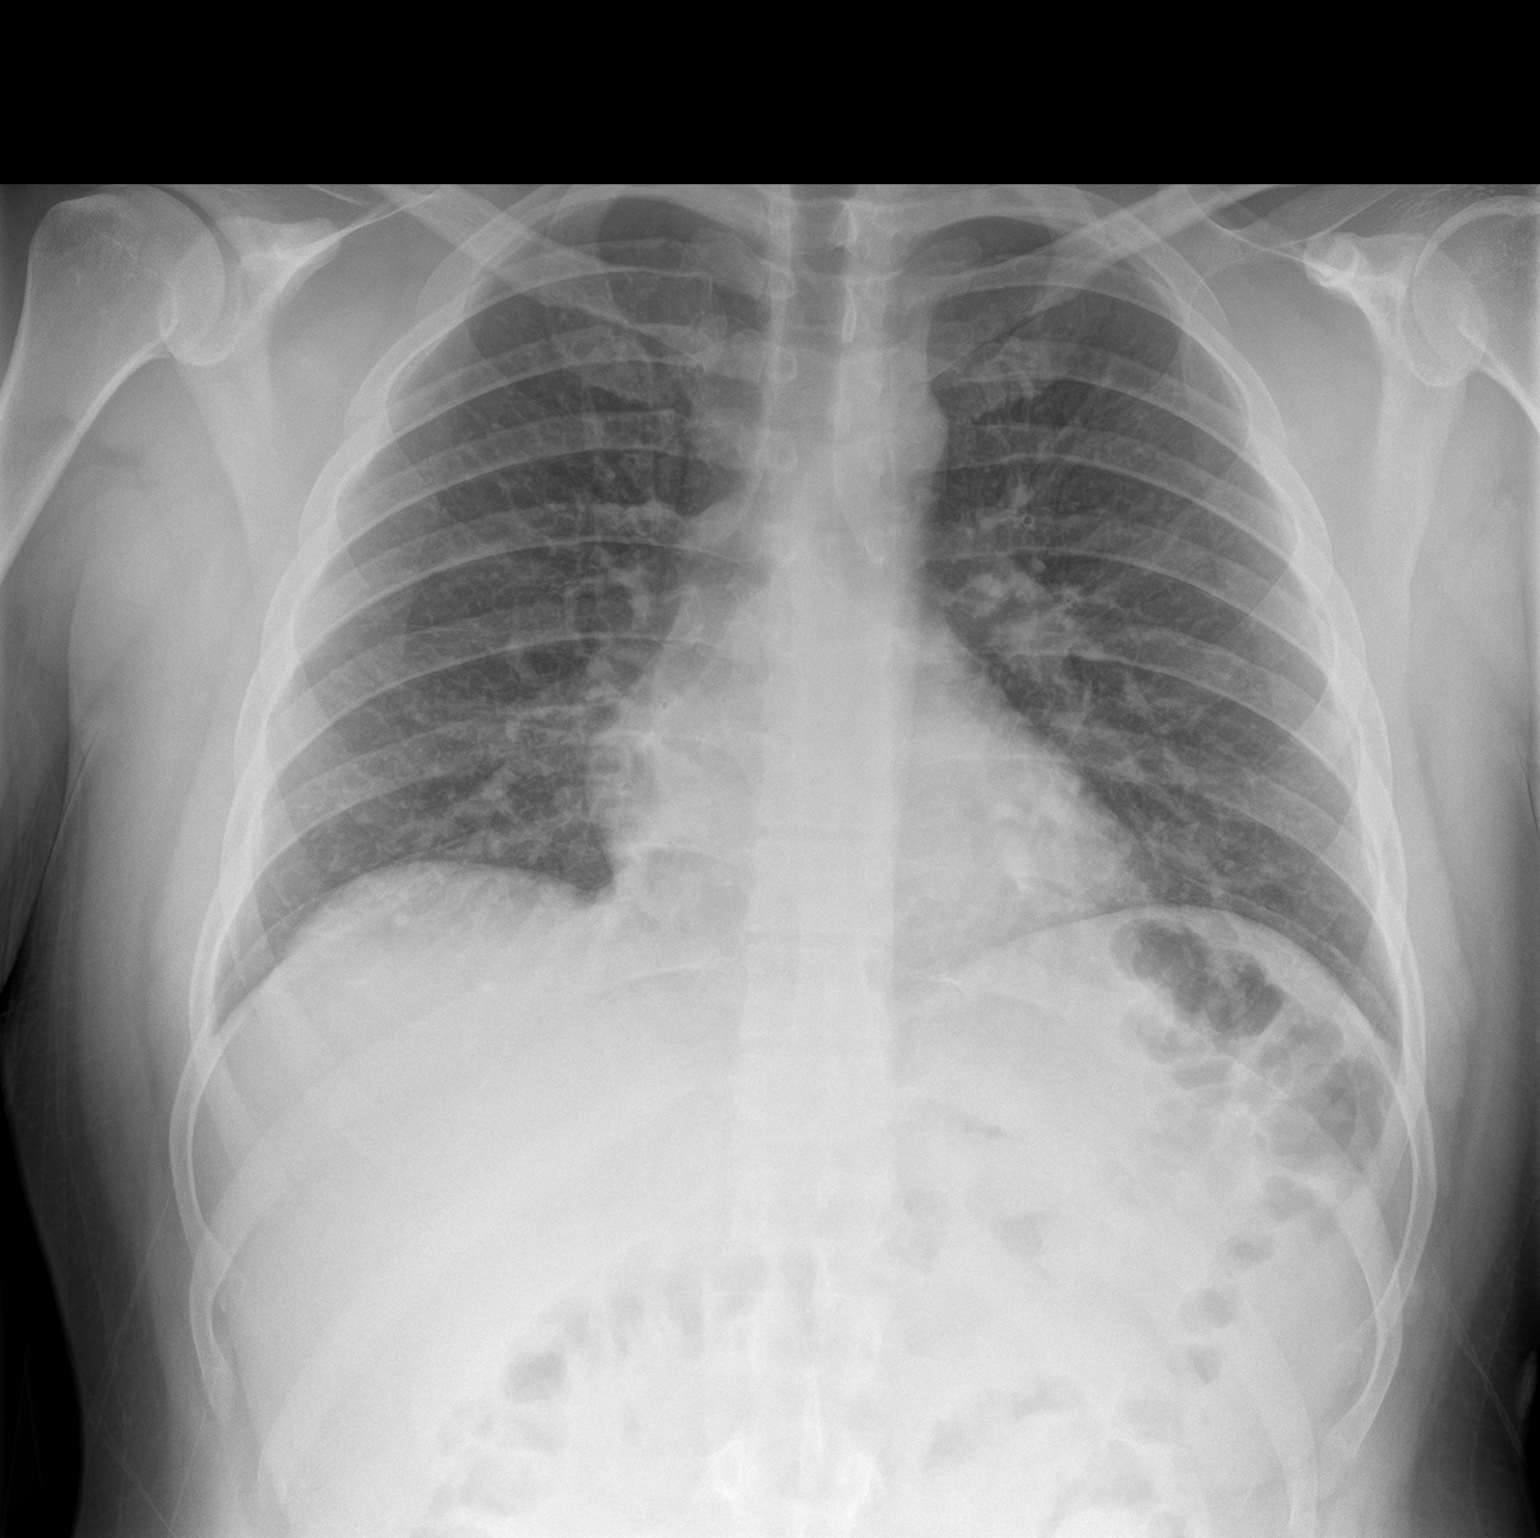

[chest lat]
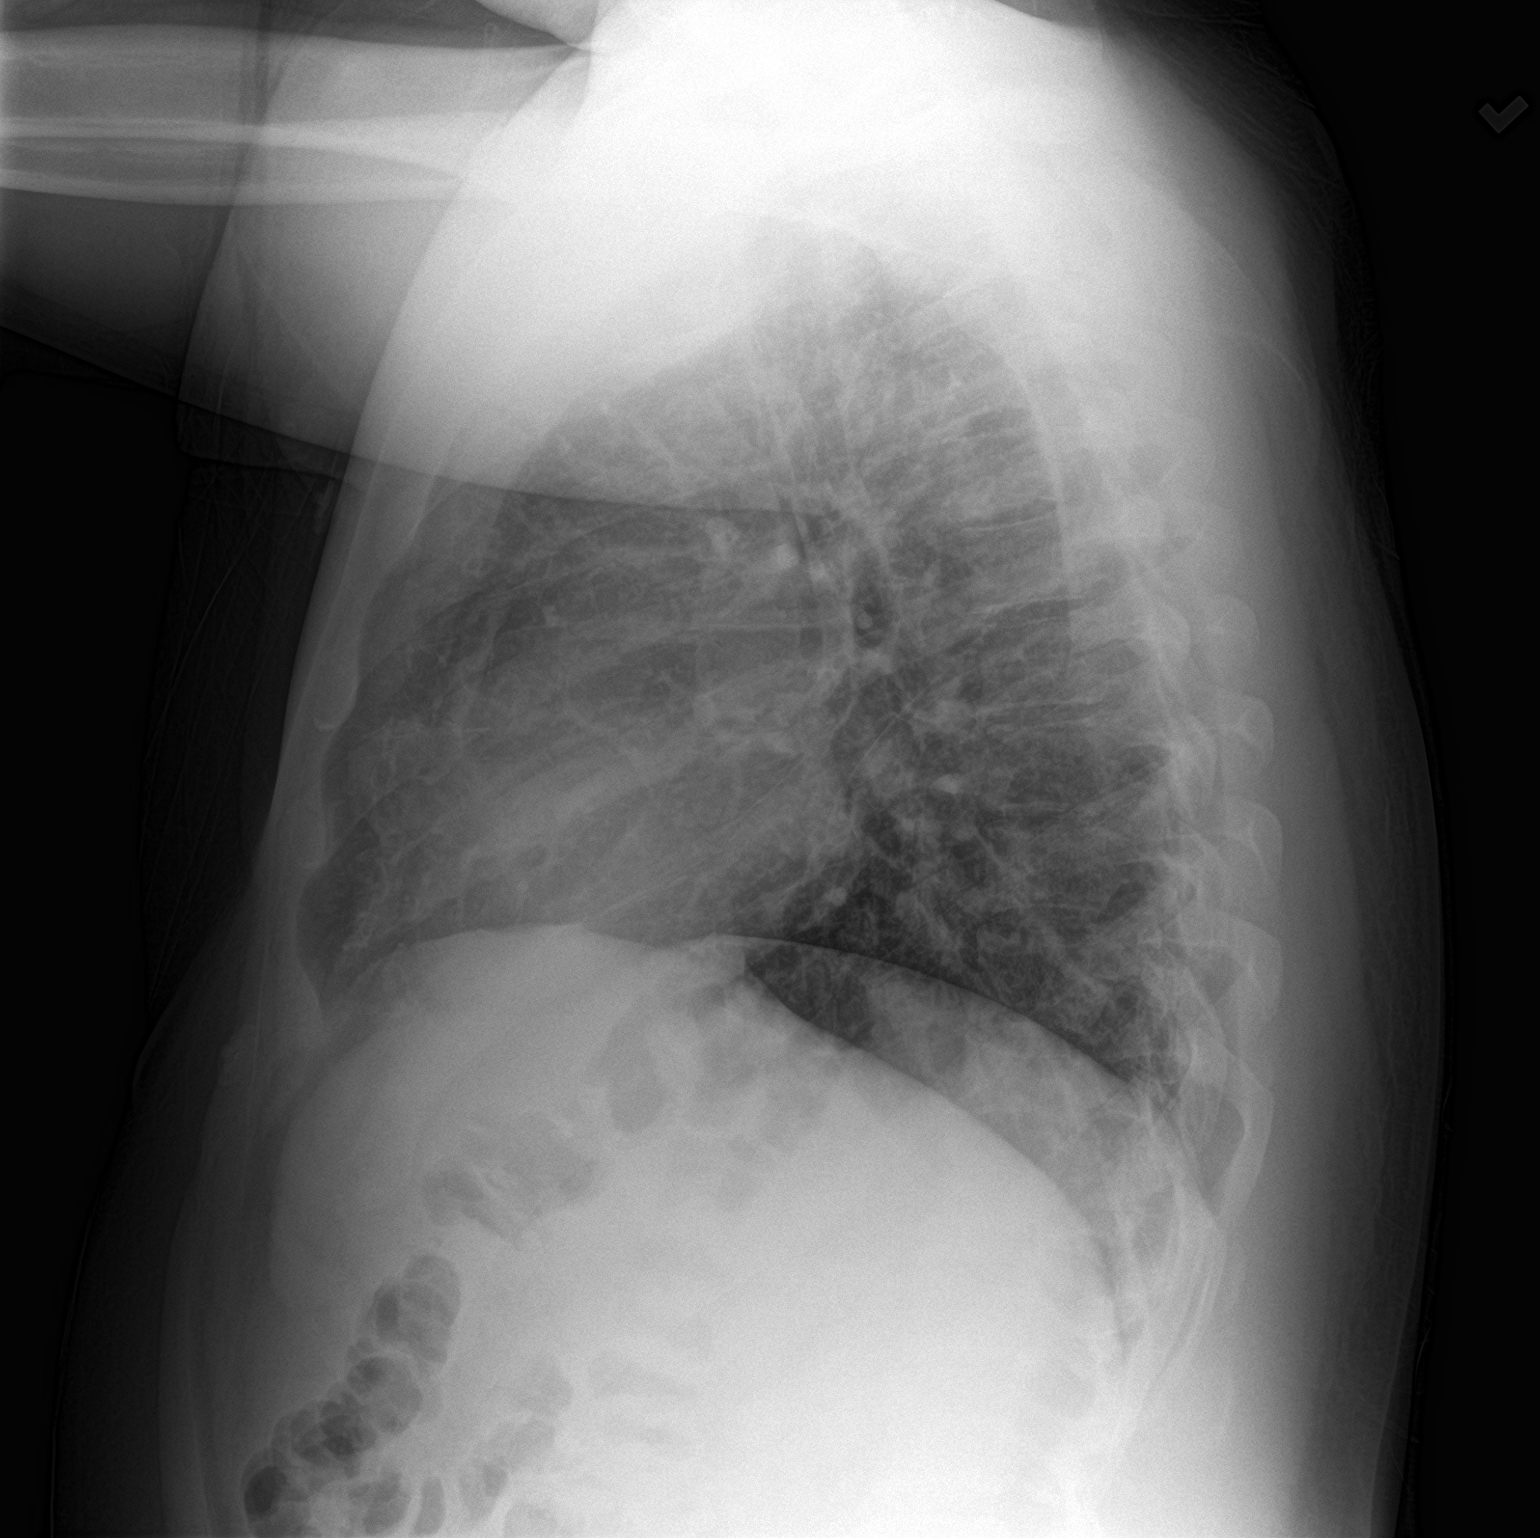

[2 of 2 positions shown; findings below may reference images not displayed]

FINDINGS: The no focal consolidation, pleural effusion or pneumothorax. The
cardiac silhouette is within limits. No acute osseous pathology.
IMPRESSION: No active cardiopulmonary disease.
